# Patient Record
Sex: Female | Born: 2000 | Hispanic: Yes | Marital: Single | Smoking: Never smoker
Health system: Southern US, Community
[De-identification: ages and names within clinical notes are randomized; demographics above are authoritative.]

---
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
## 2020-02-23 ENCOUNTER — Other Ambulatory Visit: Payer: Self-pay | Admitting: Women's Health

## 2020-04-04 ENCOUNTER — Ambulatory Visit (INDEPENDENT_AMBULATORY_CARE_PROVIDER_SITE_OTHER): Payer: Self-pay | Admitting: Women's Health

## 2020-04-04 ENCOUNTER — Other Ambulatory Visit (HOSPITAL_COMMUNITY)
Admission: RE | Admit: 2020-04-04 | Discharge: 2020-04-04 | Disposition: A | Discharge status: Home / Self Care | Payer: Medicaid - Out of State | Source: Ambulatory Visit | Attending: Women's Health | Admitting: Women's Health

## 2020-04-04 ENCOUNTER — Encounter: Payer: Self-pay | Admitting: Women's Health

## 2020-04-04 ENCOUNTER — Other Ambulatory Visit: Payer: Self-pay

## 2020-04-04 DIAGNOSIS — M549 Dorsalgia, unspecified: Secondary | ICD-10-CM

## 2020-04-04 DIAGNOSIS — O99891 Other specified diseases and conditions complicating pregnancy: Secondary | ICD-10-CM

## 2020-04-04 DIAGNOSIS — O0990 Supervision of high risk pregnancy, unspecified, unspecified trimester: Secondary | ICD-10-CM

## 2020-04-04 DIAGNOSIS — Z3490 Encounter for supervision of normal pregnancy, unspecified, unspecified trimester: Secondary | ICD-10-CM | POA: Insufficient documentation

## 2020-04-04 MED ORDER — BLOOD PRESSURE KIT DEVI: 1.0000 | 0 refills | Status: DC | PRN

## 2020-04-04 NOTE — Progress Notes (Signed)
History:   Donna Simon is a 19 y.o. G1P0 at 12w6dby LMP being seen today for her first obstetrical visit.  Her obstetrical history is significant for first pregnancy. Patient does intend to breast feed. Pregnancy history fully reviewed.  Pt reports this is a desired and planned pregnancy. Allergies: NKDA Current Medications: PNVs PMH: No HTN, DM, asthma. PSH: appendectomy OB Hx: first pregnancy Social Hx: pt does not smoke, drink, or use drugs. Family Hx: none  Patient reports backache.      HISTORY: OB History  Gravida Para Term Preterm AB Living  1 0 0 0 0 0  SAB TAB Ectopic Multiple Live Births  0 0 0 0 0    # Outcome Date GA Lbr Len/2nd Weight Sex Delivery Anes PTL Lv  1 Current             Last pap smear n/a d/t age 89  History reviewed. No pertinent past medical history. Past Surgical History:  Procedure Laterality Date  . APPENDECTOMY Right 2009   History reviewed. No pertinent family history. Social History   Tobacco Use  . Smoking status: Not on file  Substance Use Topics  . Alcohol use: Not on file  . Drug use: Not on file   No Known Allergies Current Outpatient Medications on File Prior to Visit  Medication Sig Dispense Refill  . Prenatal Vit-Fe Fumarate-FA (PRENATAL MULTIVITAMIN) TABS tablet Take 1 tablet by mouth daily at 12 noon.     No current facility-administered medications on file prior to visit.    Review of Systems Pertinent items noted in HPI and remainder of comprehensive ROS otherwise negative. Physical Exam:   Vitals:   04/04/20 1429 04/04/20 1430  BP: 107/65   Pulse: 74   Weight: 168 lb 12.8 oz (76.6 kg)   Height:  _0  (1.651 m)   Fetal Heart Rate (bpm): 146  Uterus:   15wk size  Pelvic Exam: Perineum: no hemorrhoids, normal perineum   Vulva: normal external genitalia, no lesions   Vagina:  Not examined   Cervix: Not examined   Adnexa: Not examined   Bony Pelvis: Not examined  System: General: well-developed,  well-nourished female in no acute distress   Breasts:  Not examined   Skin: normal coloration and turgor, no rashes   Neurologic: oriented, normal, negative, normal mood   Extremities: normal strength, tone, and muscle mass, ROM of all joints is normal   HEENT PERRLA, extraocular movement intact and sclera clear, anicteric   Mouth/Teeth Not examined   Neck supple and no masses   Cardiovascular: regular rate and rhythm   Respiratory:  no respiratory distress, normal breath sounds   Abdomen: soft, non-tender; bowel sounds normal; no masses,  no organomegaly    Assessment:    Pregnancy: G1P0 Patient Active Problem List   Diagnosis Date Noted  . Encounter for supervision of normal first pregnancy in second trimester 04/04/2020  . Back pain in pregnancy 04/04/2020     Plan:    1. Supervision of high risk pregnancy, antepartum - Blood Pressure Monitoring (BLOOD PRESSURE KIT) DEVI; 1 Device by Does not apply route as needed.  Dispense: 1 each; Refill: 0 - AFP, Serum, Open Spina Bifida - CBC/D/Plt+RPR+Rh+ABO+Rub Ab... - Urine Culture - Cervicovaginal ancillary only( [place]) - HgB A1c - [place] OB COMP + 14 WK; Future  2. Back pain in pregnancy - AMB referral to rehabilitation   Initial labs drawn. Continue prenatal vitamins. Problem list reviewed and  updated. Genetic Screening discussed, NIPS: results reviewed. Ultrasound discussed; fetal anatomic survey: ordered. Anticipatory guidance about prenatal visits given including labs, ultrasounds, and testing. Discussed usage of Bab[employer]s and virtual visits as additional source of managing and completing prenatal visits in midst of lcoronavirus and pandemic.   Encouraged to complete MyChart Registration for her ability to  review results, send requests, and have questions addressed.  The nature of [place] [facility] with multiple MDs and Advanced Practice Providers was explained to  patient; also emphasized that residents, students are part of our team. Routine obstetric precautions reviewed. Encouraged to seek out care at office or emergency room [facility] preferred) for urgent and/or emergent concerns. Return in about 4 weeks (around 05/02/2020) for in-person [name]/APP OK, needs anatomy scan scheduled, pelvic PT referral.   Clarisa Fling, NP  3:07 PM 04/04/2020

## 2020-04-04 NOTE — Patient Instructions (Addendum)
Maternity Assessment Unit (MAU)  The Maternity Assessment Unit (MAU) is located at the [facility] at [facility]. The address is: [address] [place], [place], [place] [address]. Please see map below for additional directions.    The Maternity Assessment Unit is designed to help you during your pregnancy, and for up to 6 weeks after delivery, with any pregnancy- or postpartum-related emergencies, if you think you are in labor, or if your water has broken. For example, if you experience nausea and vomiting, vaginal bleeding, severe abdominal or pelvic pain, elevated blood pressure or other problems related to your pregnancy or postpartum time, please come to the Maternity Assessment Unit for assistance.        Second Trimester of Pregnancy The second trimester is from week 14 through week 27 (months 4 through 6). The second trimester is often a time when you feel your best. Your body has adjusted to being pregnant, and you begin to feel better physically. Usually, morning sickness has lessened or quit completely, you may have more energy, and you may have an increase in appetite. The second trimester is also a time when the fetus is growing rapidly. At the end of the sixth month, the fetus is about 9 inches long and weighs about 1 pounds. You will likely begin to feel the baby move (quickening) between 16 and 20 weeks of pregnancy. Body changes during your second trimester Your body continues to go through many changes during your second trimester. The changes vary from woman to woman.  Your weight will continue to increase. You will notice your lower abdomen bulging out.  You may begin to get stretch marks on your hips, abdomen, and breasts.  You may develop headaches that can be relieved by medicines. The medicines should be approved by your health care provider.  You may urinate more often because the fetus is pressing on your bladder.  You may  develop or continue to have heartburn as a result of your pregnancy.  You may develop constipation because certain hormones are causing the muscles that push waste through your intestines to slow down.  You may develop hemorrhoids or swollen, bulging veins (varicose veins).  You may have back pain. This is caused by: ? Weight gain. ? Pregnancy hormones that are relaxing the joints in your pelvis. ? A shift in weight and the muscles that support your balance.  Your breasts will continue to grow and they will continue to become tender.  Your gums may bleed and may be sensitive to brushing and flossing.  Dark spots or blotches (chloasma, mask of pregnancy) may develop on your face. This will likely fade after the baby is born.  A dark line from your belly button to the pubic area (linea nigra) may appear. This will likely fade after the baby is born.  You may have changes in your hair. These can include thickening of your hair, rapid growth, and changes in texture. Some women also have hair loss during or after pregnancy, or hair that feels dry or thin. Your hair will most likely return to normal after your baby is born. What to expect at prenatal visits During a routine prenatal visit:  You will be weighed to make sure you and the fetus are growing normally.  Your blood pressure will be taken.  Your abdomen will be measured to track your baby's growth.  The fetal heartbeat will be listened to.  Any test results from the previous visit  will be discussed. Your health care provider may ask you:  How you are feeling.  If you are feeling the baby move.  If you have had any abnormal symptoms, such as leaking fluid, bleeding, severe headaches, or abdominal cramping.  If you are using any tobacco products, including cigarettes, chewing tobacco, and electronic cigarettes.  If you have any questions. Other tests that may be performed during your second trimester include:  Blood tests  that check for: ? Low iron levels (anemia). ? High blood sugar that affects pregnant women (gestational diabetes) between 67 and 28 weeks. ? Rh antibodies. This is to check for a protein on red blood cells (Rh factor).  Urine tests to check for infections, diabetes, or protein in the urine.  An ultrasound to confirm the proper growth and development of the baby.  An amniocentesis to check for possible genetic problems.  Fetal screens for spina bifida and Down syndrome.  HIV (human immunodeficiency virus) testing. Routine prenatal testing includes screening for HIV, unless you choose not to have this test. Follow these instructions at home: Medicines  Follow your health care provider's instructions regarding medicine use. Specific medicines may be either safe or unsafe to take during pregnancy.  Take a prenatal vitamin that contains at least 600 micrograms (mcg) of folic acid.  If you develop constipation, try taking a stool softener if your health care provider approves. Eating and drinking   Eat a balanced diet that includes fresh fruits and vegetables, whole grains, good sources of protein such as meat, eggs, or tofu, and low-fat dairy. Your health care provider will help you determine the amount of weight gain that is right for you.  Avoid raw meat and uncooked cheese. These carry germs that can cause birth defects in the baby.  If you have low calcium intake from food, talk to your health care provider about whether you should take a daily calcium supplement.  Limit foods that are high in fat and processed sugars, such as fried and sweet foods.  To prevent constipation: ? Drink enough fluid to keep your urine clear or pale yellow. ? Eat foods that are high in fiber, such as fresh fruits and vegetables, whole grains, and beans. Activity  Exercise only as directed by your health care provider. Most women can continue their usual exercise routine during pregnancy. Try to  exercise for 30 minutes at least 5 days a week. Stop exercising if you experience uterine contractions.  Avoid heavy lifting, wear low heel shoes, and practice good posture.  A sexual relationship may be continued unless your health care provider directs you otherwise. Relieving pain and discomfort  Wear a good support bra to prevent discomfort from breast tenderness.  Take warm sitz baths to soothe any pain or discomfort caused by hemorrhoids. Use hemorrhoid cream if your health care provider approves.  Rest with your legs elevated if you have leg cramps or low back pain.  If you develop varicose veins, wear support hose. Elevate your feet for 15 minutes, 3-4 times a day. Limit salt in your diet. Prenatal Care  Write down your questions. Take them to your prenatal visits.  Keep all your prenatal visits as told by your health care provider. This is important. Safety  Wear your seat belt at all times when driving.  Make a list of emergency phone numbers, including numbers for family, friends, the hospital, and police and fire departments. General instructions  Ask your health care provider for a referral to a  local prenatal education class. Begin classes no later than the beginning of month 6 of your pregnancy.  Ask for help if you have counseling or nutritional needs during pregnancy. Your health care provider can offer advice or refer you to specialists for help with various needs.  Do not use hot tubs, steam rooms, or saunas.  Do not douche or use tampons or scented sanitary pads.  Do not cross your legs for long periods of time.  Avoid cat litter boxes and soil used by cats. These carry germs that can cause birth defects in the baby and possibly loss of the fetus by miscarriage or stillbirth.  Avoid all smoking, herbs, alcohol, and unprescribed drugs. Chemicals in these products can affect the formation and growth of the baby.  Do not use any products that contain nicotine  or tobacco, such as cigarettes and e-cigarettes. If you need help quitting, ask your health care provider.  Visit your dentist if you have not gone yet during your pregnancy. Use a soft toothbrush to brush your teeth and be gentle when you floss. Contact a health care provider if:  You have dizziness.  You have mild pelvic cramps, pelvic pressure, or nagging pain in the abdominal area.  You have persistent nausea, vomiting, or diarrhea.  You have a bad smelling vaginal discharge.  You have pain when you urinate. Get help right away if:  You have a fever.  You are leaking fluid from your vagina.  You have spotting or bleeding from your vagina.  You have severe abdominal cramping or pain.  You have rapid weight gain or weight loss.  You have shortness of breath with chest pain.  You notice sudden or extreme swelling of your face, hands, ankles, feet, or legs.  You have not felt your baby move in over an hour.  You have severe headaches that do not go away when you take medicine.  You have vision changes. Summary  The second trimester is from week 14 through week 27 (months 4 through 6). It is also a time when the fetus is growing rapidly.  Your body goes through many changes during pregnancy. The changes vary from woman to woman.  Avoid all smoking, herbs, alcohol, and unprescribed drugs. These chemicals affect the formation and growth your baby.  Do not use any tobacco products, such as cigarettes, chewing tobacco, and e-cigarettes. If you need help quitting, ask your health care provider.  Contact your health care provider if you have any questions. Keep all prenatal visits as told by your health care provider. This is important. This information is not intended to replace advice given to you by your health care provider. Make sure you discuss any questions you have with your health care provider. Document Revised: 11/13/2018 Document Reviewed: 08/27/2016 Elsevier  Patient Education  2020 Elsevier Inc.        Round Ligament Pain  The round ligament is a cord of muscle and tissue that helps support the uterus. It can become a source of pain during pregnancy if it becomes stretched or twisted as the baby grows. The pain usually begins in the second trimester (13-28 weeks) of pregnancy, and it can come and go until the baby is delivered. It is not a serious problem, and it does not cause harm to the baby. Round ligament pain is usually a short, sharp, and pinching pain, but it can also be a dull, lingering, and aching pain. The pain is felt in the lower side of  the abdomen or in the groin. It usually starts deep in the groin and moves up to the outside of the hip area. The pain may occur when you:  Suddenly change position, such as quickly going from a sitting to standing position.  Roll over in bed.  Cough or sneeze.  Do physical activity. Follow these instructions at home:   Watch your condition for any changes.  When the pain starts, relax. Then try any of these methods to help with the pain: ? Sitting down. ? Flexing your knees up to your abdomen. ? Lying on your side with one pillow under your abdomen and another pillow between your legs. ? Sitting in a warm bath for 15-20 minutes or until the pain goes away.  Take over-the-counter and prescription medicines only as told by your health care provider.  Move slowly when you sit down or stand up.  Avoid long walks if they cause pain.  Stop or reduce your physical activities if they cause pain.  Keep all follow-up visits as told by your health care provider. This is important. Contact a health care provider if:  Your pain does not go away with treatment.  You feel pain in your back that you did not have before.  Your medicine is not helping. Get help right away if:  You have a fever or chills.  You develop uterine contractions.  You have vaginal bleeding.  You have nausea  or vomiting.  You have diarrhea.  You have pain when you urinate. Summary  Round ligament pain is felt in the lower abdomen or groin. It is usually a short, sharp, and pinching pain. It can also be a dull, lingering, and aching pain.  This pain usually begins in the second trimester (13-28 weeks). It occurs because the uterus is stretching with the growing baby, and it is not harmful to the baby.  You may notice the pain when you suddenly change position, when you cough or sneeze, or during physical activity.  Relaxing, flexing your knees to your abdomen, lying on one side, or taking a warm bath may help to get rid of the pain.  Get help from your health care provider if the pain does not go away or if you have vaginal bleeding, nausea, vomiting, diarrhea, or painful urination. This information is not intended to replace advice given to you by your health care provider. Make sure you discuss any questions you have with your health care provider. Document Revised: 01/07/2018 Document Reviewed: 01/07/2018 Elsevier Patient Education  2020 Elsevier Inc.        Pregnancy and COVID-19 Coronavirus disease, also called COVID-19, is an infection of the lungs and airways (respiratory tract). It is unclear at this time if pregnancy makes it more likely for you to get COVID-19, or what effects the infection may have on your unborn baby. However, pregnancy causes changes to your heart, lungs, and your body's disease-fighting system (immune system). Some of these changes make it more likely for you to get sick and have more serious illness. Therefore, it is important for you to take precautions in order to protect yourself and your unborn baby. There have been studies showing that obesity and diabetes may put you at higher risk for serious illness. If you are pregnant and are obese or have diabetes, you should take extra precautions to protect yourself from the virus. Work with your health care team  to develop a plan to protect yourself from all infections, including COVID-19. This  is one way for you to stay healthy during your pregnancy and to keep your baby healthy as well. How does this affect me? If you get COVID-19, there is a risk that you may:  Get a respiratory illness that can lead to pneumonia.  Give birth to your baby before 37 weeks of pregnancy (premature birth). If you have or may have COVID-19, your health care provider may recommend special precautions around your pregnancy. This may affect how you:  Receive care before delivery (prenatal care). How you visit your health care provider may change. Tests and scans may need to be performed differently.  Receive care during labor and delivery. This may affect your birth plan, including who may be with you during labor and delivery.  Receive care after you deliver your baby (postpartum care). You may stay longer in the hospital and in a special room.  Feed your baby after he or she is born. Pregnancy can be an especially stressful time because of the changes in your body and the preparation involved in becoming a parent. In addition, you may be feeling especially fearful, anxious, or stressed because of COVID-19 and how it is affecting you. How does this affect my baby? It is not known whether a mother will transmit the virus to her unborn baby. There is a risk that if you get COVID-19:  The virus that causes COVID-19 can pass to your baby.  You may have premature birth. Your baby may require more medical care if this happens. What can I do to lower my risk?  There is no vaccine to help prevent COVID-19. However, there are actions that you can take to protect yourself and others from this virus. Cleaning and personal hygiene  Wash your hands often with soap and water for at least 20 seconds. If soap and water are not available, use alcohol-based hand sanitizer.  Avoid touching your mouth, face, eyes, or nose.  Clean  and disinfect objects and surfaces that are frequently touched every day. These may include: ? Counters and tables. ? Doorknobs and light switches. ? Sinks and faucets. ? Electronics such as phones, remote controls, keyboards, computers, and tablets. Stay away from others  Stay away from people who are sick, if possible.  Avoid social gatherings and travel.  Stay home as much as possible. Follow these instructions: Breastfeeding It is not known if the virus that causes COVID-19 can pass through breast milk to your baby. You should make a plan for feeding your infant with your family and your health care team. If you have or may have COVID-19, your health care provider may recommend that you take precautions while breastfeeding, such as:  Washing your hands before feeding your baby.  Wearing a mask while feeding your baby.  Pumping or expressing breast milk to feed to your baby. If possible, ask someone in your household who is not sick to feed your baby the expressed breast milk. ? Wash your hands before touching pump parts. ? Wash and disinfect all pump parts after expressing milk. Follow the manufacturer's instructions to clean and disinfect all pump parts. General instructions  If you think you have a COVID-19 infection, contact your health care provider right away. Tell your health care provider that you think you may have a COVID-19 infection.  Follow your health care provider's instructions on taking medicines. Some medicines may be unsafe to take during pregnancy.  Cover your mouth and nose by wearing a mask or other cloth covering over  your face when you go out in public.  Find ways to manage stress. These may include: ? Using relaxation techniques like meditation and deep breathing. ? Getting regular exercise. Most women can continue their usual exercise routine during pregnancy. Ask your health care provider what activities are safe for you. ? Seeking support from family,  friends, or spiritual resources. If you cannot be together in person, you can still connect by phone calls, texts, video calls, or online messaging. ? Spending time doing relaxing activities that you enjoy, like listening to music or reading a good book.  Ask for help if you have counseling or nutritional needs during pregnancy. Your health care provider can offer advice or refer you to resources or specialists who can help you with various needs.  Keep all follow-up visits as told by your health care provider. This is important. Where to find more information Centers for Disease Control and Prevention (CDC): AffordableShare.com.br World Health Organization [facility]): PokerPortraits.es [employer] (ACOG): BuyDucts.dk Questions to ask your health care team  What should I do if I have COVID-19 symptoms?  How will COVID-19 affect my prenatal care visits, tests and scans, labor and delivery, and postpartum care?  Should I plan to breastfeed my baby?  Where can I find mental health resources?  Where can I find support if I have financial concerns? Contact a health care provider if:  You have signs and symptoms of infection, including a fever or cough. Tell your health care team that you think you may have a COVID-19 infection.  You have strong emotions, such as sadness or anxiety.  You feel unsafe in your home and need help finding a safe place to live.  You have bloody or watery vaginal discharge or vaginal bleeding. Get help right away if:  You have signs or symptoms of labor before 37 weeks of pregnancy. These include: ? Contractions that are 5 minutes or less apart, or that increase in frequency, intensity, or length. ? Sudden, sharp pain in the abdomen or in the lower back. ? A gush or trickle  of fluid from your vagina.  You have signs of more serious illness such as: ? You have difficulty breathing. ? You have chest pain. ? You have a fever greater than 102F (39C) or higher that does not go away. ? You cannot drink fluids without vomiting. ? You feel extremely weak or you faint. These symptoms may represent a serious problem that is an emergency. Do not wait to see if the symptoms will go away. Get medical help right away. Call your local emergency services (911 in the [place] Do not drive yourself to the hospital. Summary  Coronavirus disease, also called COVID-19, is an infection of the lungs and airways (respiratory tract). It is unclear at this time if pregnancy makes you more susceptible to COVID-19 and what effects it may have on unborn babies.  It is important to take precautions to protect yourself and your developing baby. This includes washing your hands often, avoiding touching your mouth, face, eyes, or nose, avoiding social gatherings and travel, and staying away from people who are sick.  If you think you have a COVID-19 infection, contact your health care provider right away. Tell your health care provider that you think you may have a COVID-19 infection.  If you have or may have COVID-19, your health care provider may recommend special precautions during your pregnancy, labor and delivery, and after your baby is born. This  information is not intended to replace advice given to you by your health care provider. Make sure you discuss any questions you have with your health care provider. Document Revised: 05/14/2019 Document Reviewed: 11/17/2018 Elsevier Patient Education  [place].

## 2020-04-05 LAB — CERVICOVAGINAL ANCILLARY ONLY
Chlamydia: NEGATIVE
Comment: NEGATIVE
Comment: NEGATIVE
Comment: NORMAL
Neisseria Gonorrhea: NEGATIVE
Trichomonas: NEGATIVE

## 2020-04-06 LAB — AFP, SERUM, OPEN SPINA BIFIDA
AFP MoM: 1.41
AFP Value: 45.1 ng/mL
Gest. Age on Collection Date: 16 weeks
Maternal Age At EDD: 21.3 yr
OSBR Risk 1 IN: 3501
Test Results:: NEGATIVE
Weight: 168 [lb_av]

## 2020-04-06 LAB — CBC/D/PLT+RPR+RH+ABO+RUB AB...
Antibody Screen: NEGATIVE
Basophils Absolute: 0 10*3/uL (ref 0.0–0.2)
Basos: 0 %
EOS (ABSOLUTE): 0 10*3/uL (ref 0.0–0.4)
Eos: 0 %
HCV Ab: <0.1 s/co ratio (ref 0.0–0.9)
HIV Screen 4th Generation wRfx: NONREACTIVE
Hematocrit: 36.7 % (ref 34.0–46.6)
Hemoglobin: 12.6 g/dL (ref 11.1–15.9)
Hepatitis B Surface Ag: NEGATIVE
Immature Grans (Abs): 0 10*3/uL (ref 0.0–0.1)
Immature Granulocytes: 0 %
Lymphocytes Absolute: 2 10*3/uL (ref 0.7–3.1)
Lymphs: 25 %
MCH: 32.5 pg (ref 26.6–33.0)
MCHC: 34.3 g/dL (ref 31.5–35.7)
MCV: 95 fL (ref 79–97)
Monocytes Absolute: 0.5 10*3/uL (ref 0.1–0.9)
Monocytes: 6 %
Neutrophils Absolute: 5.5 10*3/uL (ref 1.4–7.0)
Neutrophils: 69 %
Platelets: 226 10*3/uL (ref 150–450)
RBC: 3.88 x10E6/uL (ref 3.77–5.28)
RDW: 13.2 % (ref 11.7–15.4)
RPR Ser Ql: NONREACTIVE
Rh Factor: POSITIVE
Rubella Antibodies, IGG: 3.44 index (ref >0.99)
WBC: 8.1 10*3/uL (ref 3.4–10.8)

## 2020-04-06 LAB — HEMOGLOBIN A1C
Est. average glucose Bld gHb Est-mCnc: 100 mg/dL
Hgb A1c MFr Bld: 5.1 % (ref 4.8–5.6)

## 2020-04-06 LAB — URINE CULTURE: Organism ID, Bacteria: NO GROWTH

## 2020-04-06 LAB — HCV INTERPRETATION

## 2020-04-12 ENCOUNTER — Telehealth: Payer: Self-pay

## 2020-04-12 NOTE — Telephone Encounter (Signed)
TC to pt regarding not feeling well per front desk  Pt wanting sooner appt and non ava I called pt to discuss sx's and concerns no answer LVM.

## 2020-05-02 ENCOUNTER — Other Ambulatory Visit: Payer: Self-pay

## 2020-05-02 ENCOUNTER — Ambulatory Visit (INDEPENDENT_AMBULATORY_CARE_PROVIDER_SITE_OTHER): Payer: Medicaid Other | Admitting: Advanced Practice Midwife

## 2020-05-02 VITALS — BP 113/66 | HR 71 | Wt 175.0 lb

## 2020-05-02 DIAGNOSIS — Z3A19 19 weeks gestation of pregnancy: Secondary | ICD-10-CM

## 2020-05-02 DIAGNOSIS — M549 Dorsalgia, unspecified: Secondary | ICD-10-CM

## 2020-05-02 DIAGNOSIS — O99891 Other specified diseases and conditions complicating pregnancy: Secondary | ICD-10-CM

## 2020-05-02 DIAGNOSIS — Z3402 Encounter for supervision of normal first pregnancy, second trimester: Secondary | ICD-10-CM

## 2020-05-02 NOTE — Progress Notes (Signed)
   PRENATAL VISIT NOTE  Subjective:  Donna Simon is a 19 y.o. G1P0 at [redacted]w[redacted]d being seen today for ongoing prenatal care.  She is currently monitored for the following issues for this low-risk pregnancy and has Encounter for supervision of normal first pregnancy in second trimester and Back pain in pregnancy on their problem list.  Patient reports no complaints.  Contractions: Not present. Vag. Bleeding: None.  Movement: Present. Denies leaking of fluid.   The following portions of the patient's history were reviewed and updated as appropriate: allergies, current medications, past family history, past medical history, past social history, past surgical history and problem list.   Objective:   Vitals:   05/02/20 0835  BP: 113/66  Pulse: 71  Weight: 175 lb (79.4 kg)    Fetal Status: Fetal Heart Rate (bpm): 150   Movement: Present     General:  Alert, oriented and cooperative. Patient is in no acute distress.  Skin: Skin is warm and dry. No rash noted.   Cardiovascular: Normal heart rate noted  Respiratory: Normal respiratory effort, no problems with respiration noted  Abdomen: Soft, gravid, appropriate for gestational age.  Pain/Pressure: Present     Pelvic: Cervical exam deferred        Extremities: Normal range of motion.  Edema: None  Mental Status: Normal mood and affect. Normal behavior. Normal judgment and thought content.   Assessment and Plan:  Pregnancy: G1P0 at [redacted]w[redacted]d 1. Encounter for supervision of normal first pregnancy in second trimester --Anticipatory guidance about next visits/weeks of pregnancy given. --Anatomy [place] scheduled today --pt desires waterbirth, discussed, info on class/contraindications given today --Midwife for next visit in 4 weeks  2. Back pain in pregnancy --No pain currently   Preterm labor symptoms and general obstetric precautions including but not limited to vaginal bleeding, contractions, leaking of fluid and fetal movement were reviewed in  detail with the patient. Please refer to After Visit Summary for other counseling recommendations.   Return in about 4 weeks (around 05/30/2020).  Future Appointments  Date Time Provider Department Center  05/03/2020  7:45 AM [facility]  05/29/2020  4:00 PM Leftwich-Kirby, Wilmer Floor, CNM [facility] None    Sharen Counter, CNM

## 2020-05-02 NOTE — Patient Instructions (Addendum)
Considering Waterbirth? °Guide for patients at [facility] °Why consider waterbirth? °• Gentle birth for babies  °• Less pain medicine used in labor  °• May allow for passive descent/less pushing  °• May reduce perineal tears  °• More mobility and instinctive maternal position changes  °• Increased maternal relaxation  °• Reduced blood pressure in labor  ° °Is waterbirth safe? What are the risks of infection, drowning or other complications? °• Infection:  °• Very low risk (3.7 % for tub vs 4.8% for bed)  °• 7 in 8000 waterbirths with documented infection  °• Poorly cleaned equipment most common cause  °• Slightly lower group B strep transmission rate  °• Drowning  °• Maternal:  °• Very low risk  °• Related to seizures or fainting  °• Newborn:  °• Very low risk. No evidence of increased risk of respiratory problems in multiple large studies  °• Physiological protection from breathing under water  °• Avoid underwater birth if there are any fetal complications  °• Once baby's head is out of the water, keep it out.  °• Birth complication  °• Some reports of cord trauma, but risk decreased by bringing baby to surface gradually  °• No evidence of increased risk of shoulder dystocia. Mothers can usually change positions faster in water than in a bed, possibly aiding the maneuvers to free the shoulder.  °? °You must attend a Waterbirth class at [facility] [place] °• 3rd Wednesday of every month from 7-9pm  °• Free  °• Register by calling [phone] or online at [email or web address]  °• Bring us the certificate from the class to your prenatal appointment  °Meet with a midwife at 36 weeks to see if you can still plan a waterbirth and to sign the consent.  ° °If you plan a waterbirth at [facility] at , you can opt to purchase the following: °• Fish Net °• Bathing suit top (optional)  °• Long-handled mirror (optional)  °•  °Things that would  prevent you from having a waterbirth: °• Unknown or Positive COVID-19 diagnosis upon admission to hospital  °• Premature, <37wks  °• Previous cesarean birth  °• Presence of thick meconium-stained fluid  °• Multiple gestation (Twins, triplets, etc.)  °• Uncontrolled diabetes or gestational diabetes requiring medication  °• Hypertension requiring medication or diagnosis of pre-eclampsia  °• Heavy vaginal bleeding  °• Non-reassuring fetal heart rate  °• Active infection (MRSA, etc.). Group B Strep is NOT a contraindication for waterbirth.  °• If your labor has to be induced and induction method requires continuous monitoring of the baby's heart rate  °• Other risks/issues identified by your obstetrical provider  °Please remember that birth is unpredictable. Under certain unforeseeable circumstances your provider may advise against giving birth in the tub. These decisions will be made on a case-by-case basis and with the safety of you and your baby as our highest priority. ° °**Please remember that in order to have a waterbirth, you must test Negative to COVID-19 upon admission to the hospital.** ° °

## 2020-05-03 ENCOUNTER — Ambulatory Visit: Payer: Medicaid Other | Attending: Women's Health

## 2020-05-03 ENCOUNTER — Other Ambulatory Visit: Payer: Self-pay | Admitting: Women's Health

## 2020-05-03 DIAGNOSIS — O0990 Supervision of high risk pregnancy, unspecified, unspecified trimester: Secondary | ICD-10-CM

## 2020-05-03 DIAGNOSIS — O358XX0 Maternal care for other (suspected) fetal abnormality and damage, not applicable or unspecified: Secondary | ICD-10-CM

## 2020-05-03 DIAGNOSIS — Z363 Encounter for antenatal screening for malformations: Secondary | ICD-10-CM

## 2020-05-03 DIAGNOSIS — Z3A20 20 weeks gestation of pregnancy: Secondary | ICD-10-CM

## 2020-05-08 ENCOUNTER — Encounter: Payer: Self-pay | Admitting: Women's Health

## 2020-05-08 DIAGNOSIS — O283 Abnormal ultrasonic finding on antenatal screening of mother: Secondary | ICD-10-CM | POA: Insufficient documentation

## 2020-05-29 ENCOUNTER — Telehealth (INDEPENDENT_AMBULATORY_CARE_PROVIDER_SITE_OTHER): Payer: Medicaid Other | Admitting: Advanced Practice Midwife

## 2020-05-29 ENCOUNTER — Other Ambulatory Visit: Payer: Self-pay

## 2020-05-29 VITALS — BP 127/67 | HR 75

## 2020-05-29 DIAGNOSIS — O283 Abnormal ultrasonic finding on antenatal screening of mother: Secondary | ICD-10-CM

## 2020-05-29 DIAGNOSIS — O99891 Dorsalgia, unspecified: Secondary | ICD-10-CM

## 2020-05-29 DIAGNOSIS — O26892 Other specified pregnancy related conditions, second trimester: Secondary | ICD-10-CM

## 2020-05-29 DIAGNOSIS — M549 Dorsalgia, unspecified: Secondary | ICD-10-CM

## 2020-05-29 DIAGNOSIS — O368120 Decreased fetal movements, second trimester, not applicable or unspecified: Secondary | ICD-10-CM

## 2020-05-29 DIAGNOSIS — Z3402 Encounter for supervision of normal first pregnancy, second trimester: Secondary | ICD-10-CM

## 2020-05-29 DIAGNOSIS — Z3A23 23 weeks gestation of pregnancy: Secondary | ICD-10-CM

## 2020-05-29 NOTE — Progress Notes (Signed)
I connected with  Donna Simon on 05/29/20 by a video enabled telemedicine application and verified that I am speaking with the correct person using two identifiers.   I discussed the limitations of evaluation and management by telemedicine. The patient expressed understanding and agreed to proceed.   MyChart OB, c/o pressure and baby moving less.

## 2020-05-29 NOTE — Progress Notes (Signed)
OBSTETRICS PRENATAL VIRTUAL VISIT ENCOUNTER NOTE  Provider location: [facility] [place]   I connected with Donna Simon on 05/29/20 at  4:00 PM EDT by MyChart Video Encounter at home and verified that I am speaking with the correct person using two identifiers.   I discussed the limitations, risks, security and privacy concerns of performing an evaluation and management service virtually and the availability of in person appointments. I also discussed with the patient that there may be a patient responsible charge related to this service. The patient expressed understanding and agreed to proceed. Subjective:  Donna Simon is a 19 y.o. G1P0 at [redacted]w[redacted]d being seen today for ongoing prenatal care.  She is currently monitored for the following issues for this low-risk pregnancy and has Encounter for supervision of normal first pregnancy in second trimester; Back pain in pregnancy; and Echogenic intracardiac focus of fetus on prenatal ultrasound on their problem list.  Patient reports decreased fetal movement.  Contractions: Not present. Vag. Bleeding: None.  Movement: Present. Denies any leaking of fluid.   The following portions of the patient's history were reviewed and updated as appropriate: allergies, current medications, past family history, past medical history, past social history, past surgical history and problem list.   Objective:   Vitals:   05/29/20 1325  BP: 127/67  Pulse: 75    Fetal Status:     Movement: Present     General:  Alert, oriented and cooperative. Patient is in no acute distress.  Respiratory: Normal respiratory effort, no problems with respiration noted  Mental Status: Normal mood and affect. Normal behavior. Normal judgment and thought content.  Rest of physical exam deferred due to type of encounter  Imaging: [place] MFM OB DETAIL +14 WK  Result Date: 05/03/2020 ----------------------------------------------------------------------   OBSTETRICS REPORT                       (Signed Final 05/03/2020 09:09 am) ---------------------------------------------------------------------- Patient Info  ID #:       [id number]                          D.O.B.:  [birth year] (19 yrs)  Name:       Donna Simon                 Visit Date: 05/03/2020 07:52 am ---------------------------------------------------------------------- Performed By  Attending:        Lin Landsman      Referred By:      Marylen Ponto                    MD  Performed By:     Lenise Arena        [facility] ---------------------------------------------------------------------- Orders  #  Description                           Code        Ordered By  1  [place] MFM OB DETAIL +14 WK               [id number]    NICOLE NUGENT ----------------------------------------------------------------------  #  Order #                     Accession #                Episode #  1  [id number]                   [id number]                 [id number] ---------------------------------------------------------------------- Indications  Encounter for antenatal screening for          Z36.3  malformations (neg AFP)  [redacted] weeks gestation of pregnancy                Z3A.20  Echogenic intracardiac focus of the heart      O35.8XX0  (EIF) ---------------------------------------------------------------------- Vital Signs  Weight (lb): 175                               Height:        5'5"  BMI:         29.12 ---------------------------------------------------------------------- Fetal Evaluation  Num Of Fetuses:         1  Fetal Heart Rate(bpm):  155  Cardiac Activity:       Observed  Presentation:           Cephalic  Placenta:               Anterior  P. Cord Insertion:      Visualized, central   Amniotic Fluid  AFI FV:      Within normal limits                              Largest Pocket(cm)                              4.53 ---------------------------------------------------------------------- Biometry  BPD:      45.5  mm     G. Age:  19w 5d         39  %    CI:        71.96   %    70 - 86                                                          FL/HC:  19.9   %    16.8 - 19.8  HC:      170.7  mm     G. Age:  19w 5d         27  %    HC/AC:      1.11        1.09 - 1.39  AC:      154.2  mm     G. Age:  20w 4d         65  %    FL/BPD:     74.7   %  FL:         34  mm     G. Age:  20w 5d         67  %    FL/AC:      22.0   %    20 - 24  CER:      20.1  mm     G. Age:  19w 3d         52  %  LV:        5.2  mm  CM:        4.3  mm  Est. FW:     359  gm    0 lb 13 oz      75  % ---------------------------------------------------------------------- OB History  Gravidity:    1 ---------------------------------------------------------------------- Gestational Age  LMP:           20w 0d        Date:  12/15/19                 EDD:   09/20/20  U/S Today:     20w 1d                                        EDD:   09/19/20  Best:          Donna Simon 0d     Det. By:  LMP  (12/15/19)          EDD:   09/20/20 ---------------------------------------------------------------------- Anatomy  Cranium:               Appears normal         Aortic Arch:            Appears normal  Cavum:                 Appears normal         Ductal Arch:            Appears normal  Ventricles:            Appears normal         Diaphragm:              Appears normal  Choroid Plexus:        Appears normal         Stomach:                Appears normal, left  sided  Cerebellum:            Appears normal         Abdomen:                Appears normal  Posterior Fossa:       Appears normal         Abdominal Wall:         Appears nml (cord                                                                         insert, abd wall)  Nuchal Fold:           Not applicable (>20    Cord Vessels:           Appears normal ([redacted]                         wks GA)                                        vessel cord)  Face:                  Appears normal         Kidneys:                Appear normal                         (orbits and profile)  Lips:                  Appears normal         Bladder:                Appears normal  Thoracic:              Appears normal         Spine:                  Appears normal  Heart:                 Appears normal; EIF    Upper Extremities:      Appears normal  RVOT:                  Appears normal         Lower Extremities:      Appears normal  LVOT:                  Appears normal  Other:  Fetus appears to be a female. Heels and 5th digit visualized. 3VTV          visualized. ---------------------------------------------------------------------- Cervix Uterus Adnexa  Cervix  Length:           3.13  cm.  Normal appearance by transabdominal scan.  Uterus  No abnormality visualized.  Right Ovary  No adnexal mass visualized.  Left Ovary  No adnexal mass visualized.  Cul De Sac  No free fluid seen.  Adnexa  No abnormality visualized. ---------------------------------------------------------------------- Impression  Single intrauterine pregnancy here for a detailed anatomy  Normal anatomy with measurements consistent with dates  There is good fetal movement and amniotic fluid volume  An echogenic intracardiac focus was observed today. I  discussed that there is no increased risk to the function or  structure of the heart. In addition, in the context of a low risk  NIPS result the risk for aneuploidy are reduced. There were  no additional markers of aneuploidy observed.  I reviewed  that an ultrasound is a screening exam and that a diagnostic  exam via amnioticenetesis is the only test available to provide  a definitive result. She declined additional testing at this time.  ---------------------------------------------------------------------- Recommendations  Followup as clinically indicated. ----------------------------------------------------------------------               Lin Landsmanorenthian Booker, MD Electronically Signed Final Report   05/03/2020 09:09 am ----------------------------------------------------------------------   Assessment and Plan:  Pregnancy: G1P0 at 2780w5d 1. Encounter for supervision of normal first pregnancy in second trimester --Anticipatory guidance about next visits/weeks of pregnancy given. --Pt took waterbirth class, will send certificate. --Reviewed normal anatomy US, with EIF only, questions answered. --Next visit in 4 weeks in person for GTT  2. Echogenic intracardiac focus of fetus on prenatal ultrasound   3. Back pain in pregnancy --In PT  4. Decreased fetal movements in second trimester, single or unspecified fetus --Pt has doppler at home and is hearing FHT but reports less movement in last 2-3 days than usual. --Nurse visit today in the office for dopplers.  Preterm labor symptoms and general obstetric precautions including but not limited to vaginal bleeding, contractions, leaking of fluid and fetal movement were reviewed in detail with the patient. I discussed the assessment and treatment plan with the patient. The patient was provided an opportunity to ask questions and all were answered. The patient agreed with the plan and demonstrated an understanding of the instructions. The patient was advised to call back or seek an in-person office evaluation/[facility] for any urgent or concerning symptoms. Please refer to After Visit Summary for other counseling recommendations.   I provided 10 minutes of face-to-face time during this encounter.  Return in about 4 weeks (around 06/26/2020).  Future Appointments  Date Time Provider Department Center  05/29/2020  4:00 PM Hurshel PartyLeftwich-Kirby, Zohan Shiflet A, CNM  [facility] None  06/27/2020  4:00 PM Theressa MillardGray, Cheryl F, PT [facility]  07/04/2020  4:00 PM Theressa MillardGray, Cheryl F, PT [facility]  07/11/2020  4:00 PM Theressa MillardGray, Cheryl F, PT [facility]  07/25/2020  4:00 PM Theressa MillardGray, Cheryl F, PT [facility]    Sharen CounterLisa Leftwich-Kirby, CNM [facility]

## 2020-06-20 ENCOUNTER — Encounter: Payer: Medicaid Other | Attending: Women's Health | Admitting: Physical Therapy

## 2020-06-20 ENCOUNTER — Encounter: Payer: Self-pay | Admitting: Physical Therapy

## 2020-06-20 ENCOUNTER — Other Ambulatory Visit: Payer: Self-pay

## 2020-06-20 DIAGNOSIS — R252 Cramp and spasm: Secondary | ICD-10-CM | POA: Insufficient documentation

## 2020-06-20 DIAGNOSIS — M6281 Muscle weakness (generalized): Secondary | ICD-10-CM

## 2020-06-20 DIAGNOSIS — M545 Low back pain, unspecified: Secondary | ICD-10-CM | POA: Insufficient documentation

## 2020-06-20 NOTE — Therapy (Addendum)
[place] at [facility] [address], [place], [place], [address] Phone: [phone]   Fax:  [phone]  Physical Therapy Evaluation  Patient Details  Name: Donna Simon MRN: [id number] Date of Birth: Mar 13, 2001 Referring Provider (PT): Vernice Jefferson, NP   Encounter Date: 06/20/2020   PT End of Session - 06/20/20 1654    Visit Number 1    Date for PT Re-Evaluation 08/01/20    Authorization Type [employer]    PT Start Time 0459    PT Stop Time 9774    PT Time Calculation (min) 40 min    Activity Tolerance Patient tolerated treatment well;No increased pain    Behavior During Therapy [facility] for tasks assessed/performed           History reviewed. No pertinent past medical history.  Past Surgical History:  Procedure Laterality Date  . APPENDECTOMY Right 2009    There were no vitals filed for this visit.    Subjective Assessment - 06/20/20 1611    Subjective Patient started to have back pain since June 2021. As the pregnancy progresses the pain is worse.    Patient Stated Goals relief of the back pain    Currently in Pain? Yes    Pain Score 7     Pain Location Back    Pain Orientation Right;Left    Pain Descriptors / Indicators Sharp    Pain Type Acute pain    Pain Onset More than a month ago    Pain Frequency Intermittent    Aggravating Factors  laying on back and turn. most pain on right    Pain Relieving Factors not sure              [facility] PT Assessment - 06/20/20 0001      Assessment   Medical Diagnosis O99.891; M54.9 Back pain in pregnancy    Referring Provider (PT) Vernice Jefferson, NP    Onset Date/Surgical Date 01/04/20    Prior Therapy none      Precautions   Precautions Other (comment)    Precaution Comments pregnant   dur date is 09/20/2020, 22 weeks      Restrictions   Weight Bearing Restrictions No      Balance Screen   Has the patient fallen in the past 6 months Yes    How many times? 1    trying to skate   Has the patient had a decrease in activity level because of a fear of falling?  No    Is the patient reluctant to leave their home because of a fear of falling?  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    Leisure walking 3 miles per day      Cognition   Overall Cognitive Status Within Functional Limits for tasks assessed      Posture/Postural Control   Posture/Postural Control Postural limitations    Posture Comments pregnant posture      ROM / Strength   AROM / PROM / Strength AROM;PROM;Strength      AROM   Lumbar - Right Side Bend decreased by 25%    Lumbar - Left Side Bend decreased by 25%      Strength   Right Hip ABduction 3/5    Right Hip ADduction 4-/5    Left Hip ABduction  3/5    Left Hip ADduction 4-/5      Palpation   Spinal mobility decreased movement of L3-L5    SI assessment  left ilium is rotated posteriorly    Palpation comment tenderness located on the SI joint      Special Tests    Special Tests Sacrolliac Tests    Sacroiliac Tests  Pelvic Compression      Pelvic Compression   Findings Positive    Side Left    comment pain                      Objective measurements completed on examination: See above findings.     Pelvic Floor Special Questions - 06/20/20 0001    Prior Pregnancies No   presently pregnant   Urinary Leakage No            [facility] Adult PT Treatment/Exercise - 06/20/20 0001      Therapeutic Activites    Therapeutic Activities ADL's    ADL's turning in bed with keeping knees together and not rotation her back, laying on side with pillows between knees and feet and under the abdomen      Lumbar Exercises: Stretches   Quadruped Mid Back Stretch 3 reps    Quadruped Mid Back Stretch Limitations childs pose all three directions holding for 30 seconds      Lumbar  Exercises: Standing   Other Standing Lumbar Exercises standing with back against the wall and posteriorly tilting the pelvis with lower abdominal contraction holding for 5 seconds       Lumbar Exercises: Quadruped   Other Quadruped Lumbar Exercises quadruped transverse abdominus contraction for 5 seconds 10x                  PT Education - 06/20/20 1653    Education Details Access Code: [email or web address]; rolling in bed with knees together; how to lay on side with pillows    Person(s) Educated Patient    Methods Explanation;Demonstration;Handout    Comprehension Verbalized understanding;Returned demonstration            PT Short Term Goals - 06/20/20 1701      PT SHORT TERM GOAL #1   Title independent with initial HEP    Baseline not educated yet    Time 3    Period Weeks    Status New    Target Date 07/11/20             PT Long Term Goals - 06/20/20 1702      PT LONG TERM GOAL #1   Title independent with SI stabilization exercises    Baseline not educated yet    Time 6    Period Weeks    Status New    Target Date 08/01/20      PT LONG TERM GOAL #2   Title able to move in bed with no increased pain due to improve pelvic alignment and SI stability    Baseline pain level 8/10    Time 6    Period Weeks    Status New    Target Date 08/01/20      PT LONG TERM GOAL #3   Title understand ways to manage pain with correct body mechanics with daily tasks    Baseline not educated yet    Time 6    Period Weeks    Status New    Target Date 08/01/20  Plan - 06/20/20 1655    Clinical Impression Statement Patient is a 19 year old female with lumbar pain that started suddenly in June 2021. Patient is presently [redacted] weeks pregnant. Pain in lumbar is intermittent at level 7/10 and comes on suddenly. Left ilium is rotated posteriorly. Decreased movement of L3-L5. Lumbar sidebending is 25% limited. bilateral hip abduction and adductors are weak. Patient  will benefit from skilled therapy to reduce pain, improve SI stability and education on body mechanics.    Personal Factors and Comorbidities Comorbidity 1    Comorbidities [redacted]  weeks pregnant    Examination-Activity Limitations Bed Mobility;Caring for Others;Transfers    Examination-Participation Restrictions Community Activity;Cleaning    Stability/Clinical Decision Making Stable/Uncomplicated    Clinical Decision Making Low    Rehab Potential Excellent    PT Frequency 1x / week    PT Duration 6 weeks    PT Treatment/Interventions ADLs/Self Care Home Management;Moist Heat;Cryotherapy;Therapeutic activities;Therapeutic exercise;Manual techniques;Patient/family education;Neuromuscular re-education;Passive range of motion;Spinal Manipulations    PT Next Visit Plan check SI alignement; trunk rotation in sidely, sitting hip flexor isometrics; sidely iliopsoas pull back, clam, body mechanics    PT Home Exercise Plan Access Code: [email or web address]    Consulted and Agree with Plan of Care Patient           Patient will benefit from skilled therapeutic intervention in order to improve the following deficits and impairments:  Decreased endurance, Pain, Decreased activity tolerance, Decreased strength, Decreased range of motion, Increased muscle spasms  Visit Diagnosis: Muscle weakness (generalized) - Plan: PT plan of care cert/re-cert  Cramp and spasm - Plan: PT plan of care cert/re-cert  Midline low back pain without sciatica, unspecified chronicity - Plan: PT plan of care cert/re-cert     Problem List Patient Active Problem List   Diagnosis Date Noted  . Echogenic intracardiac focus of fetus on prenatal ultrasound 05/08/2020  . Encounter for supervision of normal first pregnancy in second trimester 04/04/2020  . Back pain in pregnancy 04/04/2020    Earlie Counts, PT 06/20/20 5:08 PM    [place] at [facility] [address], [place], [place],  [address] Phone: [phone]   Fax:  [phone]  Name: Daviona Herbert MRN: [id number] Date of Birth: January 17, 2001 PHYSICAL THERAPY DISCHARGE SUMMARY  Visits from Start of Care: 1  Current functional level related to goals / functional outcomes: See above. Patient has no-showed for her last 2 appointments.    Remaining deficits: See above.    Education / Equipment: HEP Plan:                                                    Patient goals were not met. Patient is being discharged due to not returning since the last visit.  Thank you for the referral. Earlie Counts, PT 07/04/20 4:46 PM  ?????

## 2020-06-20 NOTE — Patient Instructions (Signed)
Access Code: [email or web address] URL: [email or web address] Date: 06/20/2020 Prepared by: Eulis Foster  Exercises Quadruped Transversus Abdominis Bracing - 1 x daily - 7 x weekly - 1 sets - 10 reps Child's Pose Stretch - 1 x daily - 7 x weekly - 1 sets - 1 reps - 30 sec hold Child's Pose with Sidebending - 1 x daily - 7 x weekly - 2 sets - 2 reps - 30 sec hold Standing Posterior Pelvic Tilt - 1 x daily - 7 x weekly - 1 sets - 10 reps Eulis Foster, PT [facility] [address] [place], [place] [address] W: [phone] [email or web address]

## 2020-06-26 ENCOUNTER — Other Ambulatory Visit: Payer: Medicaid Other

## 2020-06-26 ENCOUNTER — Ambulatory Visit (INDEPENDENT_AMBULATORY_CARE_PROVIDER_SITE_OTHER): Payer: Medicaid Other | Admitting: Advanced Practice Midwife

## 2020-06-26 ENCOUNTER — Encounter: Payer: Self-pay | Admitting: Obstetrics

## 2020-06-26 ENCOUNTER — Other Ambulatory Visit: Payer: Self-pay

## 2020-06-26 VITALS — BP 118/66 | HR 81 | Wt 181.0 lb

## 2020-06-26 DIAGNOSIS — Z3A27 27 weeks gestation of pregnancy: Secondary | ICD-10-CM

## 2020-06-26 DIAGNOSIS — Z3402 Encounter for supervision of normal first pregnancy, second trimester: Secondary | ICD-10-CM

## 2020-06-26 DIAGNOSIS — Z23 Encounter for immunization: Secondary | ICD-10-CM

## 2020-06-26 DIAGNOSIS — M549 Dorsalgia, unspecified: Secondary | ICD-10-CM

## 2020-06-26 DIAGNOSIS — O99891 Other specified diseases and conditions complicating pregnancy: Secondary | ICD-10-CM

## 2020-06-26 NOTE — Progress Notes (Signed)
PRENATAL VISIT NOTE  Subjective:  Donna Simon is a 19 y.o. G1P0 at [redacted]w[redacted]d being seen today for ongoing prenatal care.  She is currently monitored for the following issues for this low-risk pregnancy and has Encounter for supervision of normal first pregnancy in second trimester; Back pain in pregnancy; and Echogenic intracardiac focus of fetus on prenatal ultrasound on their problem list.  Patient reports no bleeding, no contractions, no cramping and no leaking.  Contractions: Irritability. Vag. Bleeding: None.  Movement: Present. Denies leaking of fluid.   The following portions of the patient's history were reviewed and updated as appropriate: allergies, current medications, past family history, past medical history, past social history, past surgical history and problem list.   Objective:   Vitals:   06/26/20 0904  BP: 118/66  Pulse: 81  Weight: 82.1 kg    Fetal Status: Fetal Heart Rate (bpm): 138 Fundal Height: 28 cm Movement: Present     General:  Alert, oriented and cooperative. Patient is in no acute distress.  Skin: Skin is warm and dry. No rash noted.   Cardiovascular: Normal heart rate noted  Respiratory: Normal respiratory effort, no problems with respiration noted  Abdomen: Soft, gravid, appropriate for gestational age.  Pain/Pressure: Present     Pelvic: Cervical exam deferred        Extremities: Normal range of motion.  Edema: None  Mental Status: Normal mood and affect. Normal behavior. Normal judgment and thought content.   Assessment and Plan:  Pregnancy: G1P0 at [redacted]w[redacted]d 1. Encounter for supervision of normal first pregnancy in second trimester -- Education and consent for water birth   2. [redacted] weeks gestation of pregnancy --GTT 2 hour  3. Back pain in pregnancy -- Patient reports back pain has resolved with stretches from rehabilitation  Preterm labor symptoms and general obstetric precautions including but not limited to vaginal bleeding, contractions,  leaking of fluid and fetal movement were reviewed in detail with the patient. Please refer to After Visit Summary for other counseling recommendations.   Return in about 2 weeks (around 07/10/2020).  Future Appointments  Date Time Provider Department Center  06/27/2020  4:00 PM Theressa Millard, Devol [facility]  07/04/2020  4:00 PM Theressa Millard, PT [facility]  07/11/2020  4:00 PM Theressa Millard, PT [facility]  07/25/2020  4:00 PM Theressa Millard, PT [facility]    Sande Rives, Student-MidWife   CNM attestation:  I have seen and examined this patient; I agree with above documentation in the midwife student's note.   Donna Simon is a 19 y.o. G1P0 in the [facility] office for routine prenatal visit for low risk pregnancy. See problem list below. +FM, denies LOF, VB, contractions, vaginal discharge.  Patient Active Problem List   Diagnosis Date Noted  . Echogenic intracardiac focus of fetus on prenatal ultrasound 05/08/2020  . Encounter for supervision of normal first pregnancy in second trimester 04/04/2020  . Back pain in pregnancy 04/04/2020     ROS, labs, PMH reviewed  PE: BP 118/66   Pulse 81   Wt 181 lb (82.1 kg)   LMP 12/15/2019   BMI 30.12 kg/m  Gen: calm comfortable, well appearing Resp: normal effort, no distress Abd: gravid appropriate for gestational age  Fundal height: 28 cm FHT by doppler: 138  Plan: - fetal kick counts reinforced,  preterm labor precautions --Waterbirth consent completed today by CNM, pt to send class certificate by MyChart    1. Encounter for supervision  of normal first pregnancy in second trimester   2. [redacted] weeks gestation of pregnancy  - Glucose Tolerance, 2 Hours w/1 Hour - RPR - HIV antibody (with reflex) - CBC - Tdap vaccine greater than or equal to 7yo IM - Flu Vaccine QUAD 36+ mos IM (Fluarix, Quad PF)  3. Back pain in pregnancy --Resolved with PT    Sharen Counter, CNM 10:25 AM

## 2020-06-26 NOTE — Progress Notes (Signed)
[name]/GTT. FLU vaccine given in RD, TDAP in LD, tolerated well.  C/oof gaining too much weight.

## 2020-06-26 NOTE — Patient Instructions (Signed)
Third Trimester of Pregnancy  The third trimester is from week 28 through week 40 (months 7 through 9). This trimester is when your unborn baby (fetus) is growing very fast. At the end of the ninth month, the unborn baby is about 20 inches in length. It weighs about 6-10 pounds. Follow these instructions at home: Medicines  Take over-the-counter and prescription medicines only as told by your doctor. Some medicines are safe and some medicines are not safe during pregnancy.  Take a prenatal vitamin that contains at least 600 micrograms (mcg) of folic acid.  If you have trouble pooping (constipation), take medicine that will make your stool soft (stool softener) if your doctor approves. Eating and drinking   Eat regular, healthy meals.  Avoid raw meat and uncooked cheese.  If you get low calcium from the food you eat, talk to your doctor about taking a daily calcium supplement.  Eat four or five small meals rather than three large meals a day.  Avoid foods that are high in fat and sugars, such as fried and sweet foods.  To prevent constipation: ? Eat foods that are high in fiber, like fresh fruits and vegetables, whole grains, and beans. ? Drink enough fluids to keep your pee (urine) clear or pale yellow. Activity  Exercise only as told by your doctor. Stop exercising if you start to have cramps.  Avoid heavy lifting, wear low heels, and sit up straight.  Do not exercise if it is too hot, too humid, or if you are in a place of great height (high altitude).  You may continue to have sex unless your doctor tells you not to. Relieving pain and discomfort  Wear a good support bra if your breasts are tender.  Take frequent breaks and rest with your legs raised if you have leg cramps or low back pain.  Take warm water baths (sitz baths) to soothe pain or discomfort caused by hemorrhoids. Use hemorrhoid cream if your doctor approves.  If you develop puffy, bulging veins (varicose  veins) in your legs: ? Wear support hose or compression stockings as told by your doctor. ? Raise (elevate) your feet for 15 minutes, 3-4 times a day. ? Limit salt in your food. Safety  Wear your seat belt when driving.  Make a list of emergency phone numbers, including numbers for family, friends, the hospital, and police and fire departments. Preparing for your baby's arrival To prepare for the arrival of your baby:  Take prenatal classes.  Practice driving to the hospital.  Visit the hospital and tour the maternity area.  Talk to your work about taking leave once the baby comes.  Pack your hospital bag.  Prepare the baby's room.  Go to your doctor visits.  Buy a rear-facing car seat. Learn how to install it in your car. General instructions  Do not use hot tubs, steam rooms, or saunas.  Do not use any products that contain nicotine or tobacco, such as cigarettes and e-cigarettes. If you need help quitting, ask your doctor.  Do not drink alcohol.  Do not douche or use tampons or scented sanitary pads.  Do not cross your legs for long periods of time.  Do not travel for long distances unless you must. Only do so if your doctor says it is okay.  Visit your dentist if you have not gone during your pregnancy. Use a soft toothbrush to brush your teeth. Be gentle when you floss.  Avoid cat litter boxes and soil   used by cats. These carry germs that can cause birth defects in the baby and can cause a loss of your baby (miscarriage) or stillbirth.  Keep all your prenatal visits as told by your doctor. This is important. Contact a doctor if:  You are not sure if you are in labor or if your water has broken.  You are dizzy.  You have mild cramps or pressure in your lower belly.  You have a nagging pain in your belly area.  You continue to feel sick to your stomach, you throw up, or you have watery poop.  You have bad smelling fluid coming from your vagina.  You have  pain when you pee. Get help right away if:  You have a fever.  You are leaking fluid from your vagina.  You are spotting or bleeding from your vagina.  You have severe belly cramps or pain.  You lose or gain weight quickly.  You have trouble catching your breath and have chest pain.  You notice sudden or extreme puffiness (swelling) of your face, hands, ankles, feet, or legs.  You have not felt the baby move in over an hour.  You have severe headaches that do not go away with medicine.  You have trouble seeing.  You are leaking, or you are having a gush of fluid, from your vagina before you are 37 weeks.  You have regular belly spasms (contractions) before you are 37 weeks. Summary  The third trimester is from week 28 through week 40 (months 7 through 9). This time is when your unborn baby is growing very fast.  Follow your doctor's advice about medicine, food, and activity.  Get ready for the arrival of your baby by taking prenatal classes, getting all the baby items ready, preparing the baby's room, and visiting your doctor to be checked.  Get help right away if you are bleeding from your vagina, or you have chest pain and trouble catching your breath, or if you have not felt your baby move in over an hour. This information is not intended to replace advice given to you by your health care provider. Make sure you discuss any questions you have with your health care provider. Document Revised: 11/12/2018 Document Reviewed: 08/27/2016 Elsevier Patient Education  2020 [employer].   How a Baby Grows During Pregnancy  Pregnancy begins when a female's sperm enters a female's egg (fertilization). Fertilization usually happens in one of the tubes (fallopian tubes) that connect the ovaries to the womb (uterus). The fertilized egg moves down the fallopian tube to the uterus. Once it reaches the uterus, it implants into the lining of the uterus and begins to grow. For the first 10  weeks, the fertilized egg is called an embryo. After 10 weeks, it is called a fetus. As the fetus continues to grow, it receives oxygen and nutrients through tissue (placenta) that grows to support the developing baby. The placenta is the life support system for the baby. It provides oxygen and nutrition and removes waste. Learning as much as you can about your pregnancy and how your baby is developing can help you enjoy the experience. It can also make you aware of when there might be a problem and when to ask questions. How long does a typical pregnancy last? A pregnancy usually lasts 280 days, or about 40 weeks. Pregnancy is divided into three periods of growth, also called trimesters: First trimester: 0-12 weeks. Second trimester: 13-27 weeks. Third trimester: 28-40 weeks. The day  when your baby is ready to be born (full term) is your estimated date of delivery. How does my baby develop month by month? First month The fertilized egg attaches to the inside of the uterus. Some cells will form the placenta. Others will form the fetus. The arms, legs, brain, spinal cord, lungs, and heart begin to develop. At the end of the first month, the heart begins to beat. Second month The bones, inner ear, eyelids, hands, and feet form. The genitals develop. By the end of 8 weeks, all major organs are developing. Third month All of the internal organs are forming. Teeth develop below the gums. Bones and muscles begin to grow. The spine can flex. The skin is transparent. Fingernails and toenails begin to form. Arms and legs continue to grow longer, and hands and feet develop. The fetus is about 3 inches (7.6 cm) long. Fourth month The placenta is completely formed. The external sex organs, neck, outer ear, eyebrows, eyelids, and fingernails are formed. The fetus can hear, swallow, and move its arms and legs. The kidneys begin to produce urine. The skin is covered with a white, waxy coating  (vernix) and very fine hair (lanugo). Fifth month The fetus moves around more and can be felt for the first time (quickening). The fetus starts to sleep and wake up and may begin to suck its finger. The nails grow to the end of the fingers. The organ in the digestive system that makes bile (gallbladder) functions and helps to digest nutrients. If your baby is a girl, eggs are present in her ovaries. If your baby is a boy, testicles start to move down into his scrotum. Sixth month The lungs are formed. The eyes open. The brain continues to develop. Your baby has fingerprints and toe prints. Your baby's hair grows thicker. At the end of the second trimester, the fetus is about 9 inches (22.9 cm) long. Seventh month The fetus kicks and stretches. The eyes are developed enough to sense changes in light. The hands can make a grasping motion. The fetus responds to sound. Eighth month All organs and body systems are fully developed and functioning. Bones harden, and taste buds develop. The fetus may hiccup. Certain areas of the brain are still developing. The skull remains soft. Ninth month The fetus gains about  lb (0.23 kg) each week. The lungs are fully developed. Patterns of sleep develop. The fetus's head typically moves into a head-down position (vertex) in the uterus to prepare for birth. The fetus weighs 6-9 lb (2.72-4.08 kg) and is 19-20 inches (48.26-50.8 cm) long. What can I do to have a healthy pregnancy and help my baby develop? General instructions Take prenatal vitamins as directed by your health care provider. These include vitamins such as folic acid, iron, calcium, and vitamin D. They are important for healthy development. Take medicines only as directed by your health care provider. Read labels and ask a pharmacist or your health care provider whether over-the-counter medicines, supplements, and prescription drugs are safe to take during pregnancy. Keep all follow-up  visits as directed by your health care provider. This is important. Follow-up visits include prenatal care and screening tests. How do I know if my baby is developing well? At each prenatal visit, your health care provider will do several different tests to check on your health and keep track of your baby's development. These include: Fundal height and position. Your health care provider will measure your growing belly from your pubic bone to the  top of the uterus using a tape measure. Your health care provider will also feel your belly to determine your baby's position. Heartbeat. An ultrasound in the first trimester can confirm pregnancy and show a heartbeat, depending on how far along you are. Your health care provider will check your baby's heart rate at every prenatal visit. Second trimester ultrasound. This ultrasound checks your baby's development. It also may show your baby's gender. What should I do if I have concerns about my baby's development? Always talk with your health care provider about any concerns that you may have about your pregnancy and your baby. Summary A pregnancy usually lasts 280 days, or about 40 weeks. Pregnancy is divided into three periods of growth, also called trimesters. Your health care provider will monitor your baby's growth and development throughout your pregnancy. Follow your health care provider's recommendations about taking prenatal vitamins and medicines during your pregnancy. Talk with your health care provider if you have any concerns about your pregnancy or your developing baby. This information is not intended to replace advice given to you by your health care provider. Make sure you discuss any questions you have with your health care provider. Document Revised: 11/12/2018 Document Reviewed: 06/04/2017 Elsevier Patient Education  2020 [employer].

## 2020-06-27 ENCOUNTER — Telehealth: Payer: Self-pay | Admitting: Physical Therapy

## 2020-06-27 ENCOUNTER — Encounter: Payer: Medicaid Other | Admitting: Physical Therapy

## 2020-06-27 LAB — CBC
Hematocrit: 34.5 % (ref 34.0–46.6)
Hemoglobin: 11.2 g/dL (ref 11.1–15.9)
MCH: 31.1 pg (ref 26.6–33.0)
MCHC: 32.5 g/dL (ref 31.5–35.7)
MCV: 96 fL (ref 79–97)
Platelets: 228 10*3/uL (ref 150–450)
RBC: 3.6 x10E6/uL — ABNORMAL LOW (ref 3.77–5.28)
RDW: 13 % (ref 11.7–15.4)
WBC: 11.3 10*3/uL — ABNORMAL HIGH (ref 3.4–10.8)

## 2020-06-27 LAB — GLUCOSE TOLERANCE, 2 HOURS W/ 1HR
Glucose, 1 hour: 108 mg/dL (ref 65–179)
Glucose, 2 hour: 94 mg/dL (ref 65–152)
Glucose, Fasting: 70 mg/dL (ref 65–91)

## 2020-06-27 LAB — HIV ANTIBODY (ROUTINE TESTING W REFLEX): HIV Screen 4th Generation wRfx: NONREACTIVE

## 2020-06-27 LAB — RPR: RPR Ser Ql: NONREACTIVE

## 2020-06-27 NOTE — Telephone Encounter (Signed)
Called patient about her missed appointment today at 1600.  Left a message.  Eulis Foster, PT @11 /23/2021@ 4:26 PM

## 2020-07-04 ENCOUNTER — Encounter: Payer: Medicaid Other | Admitting: Physical Therapy

## 2020-07-10 ENCOUNTER — Telehealth (INDEPENDENT_AMBULATORY_CARE_PROVIDER_SITE_OTHER): Payer: Medicaid Other | Admitting: Advanced Practice Midwife

## 2020-07-10 ENCOUNTER — Encounter: Payer: Self-pay | Admitting: Advanced Practice Midwife

## 2020-07-10 VITALS — BP 105/64 | HR 83

## 2020-07-10 DIAGNOSIS — Z3400 Encounter for supervision of normal first pregnancy, unspecified trimester: Secondary | ICD-10-CM

## 2020-07-10 DIAGNOSIS — Z3A29 29 weeks gestation of pregnancy: Secondary | ICD-10-CM

## 2020-07-10 DIAGNOSIS — R12 Heartburn: Secondary | ICD-10-CM

## 2020-07-10 DIAGNOSIS — O26893 Other specified pregnancy related conditions, third trimester: Secondary | ICD-10-CM

## 2020-07-10 MED ORDER — FAMOTIDINE 20 MG PO TABS: 20.0000 mg | ORAL_TABLET | Freq: Two times a day (BID) | ORAL | 2 refills | Status: DC

## 2020-07-10 NOTE — Progress Notes (Signed)
   OBSTETRICS PRENATAL VIRTUAL VISIT ENCOUNTER NOTE  Provider location: Center for [facility] [place]   I connected with Darryl Lent on 07/10/20 at  2:00 PM EST by MyChart Video Encounter at home and verified that I am speaking with the correct person using two identifiers.   I discussed the limitations, risks, security and privacy concerns of performing an evaluation and management service virtually and the availability of in person appointments. I also discussed with the patient that there may be a patient responsible charge related to this service. The patient expressed understanding and agreed to proceed. Subjective:  Donna Simon is a 19 y.o. G1P0 at [redacted]w[redacted]d being seen today for ongoing prenatal care.  She is currently monitored for the following issues for this low-risk pregnancy and has Encounter for supervision of normal pregnancy, antepartum; Back pain in pregnancy; and Echogenic intracardiac focus of fetus on prenatal ultrasound on their problem list.  Patient reports heartburn and nausea.  Contractions: Not present. Vag. Bleeding: None.  Movement: Present. Denies any leaking of fluid.   The following portions of the patient's history were reviewed and updated as appropriate: allergies, current medications, past family history, past medical history, past social history, past surgical history and problem list.   Objective:   Vitals:   07/10/20 1350  BP: 105/64  Pulse: 83    Fetal Status:     Movement: Present     General:  Alert, oriented and cooperative. Patient is in no acute distress.  Respiratory: Normal respiratory effort, no problems with respiration noted  Mental Status: Normal mood and affect. Normal behavior. Normal judgment and thought content.  Rest of physical exam deferred due to type of encounter  Imaging: No results found.  Assessment and Plan:  Pregnancy: G1P0 at [redacted]w[redacted]d 1. [redacted] weeks gestation of pregnancy   2. Supervision of normal first  pregnancy, antepartum --Anticipatory guidance about next visits/weeks of pregnancy given. --Reviewed normal GTT results --Pt planning waterbirth, discussed how midwives are usually in house, sometimes on call from home for waterbirth.   --Next visit in 2 weeks with midwife  3. Heartburn during pregnancy in third trimester --Pt with increase in heartburn and return of n/v in last couple of weeks --Questions answered about protein shakes, which pt may use to replace meals when she is having trouble keeping food down. - famotidine (PEPCID) 20 MG tablet; Take 1 tablet (20 mg total) by mouth 2 (two) times daily.  Dispense: 60 tablet; Refill: 2  Preterm labor symptoms and general obstetric precautions including but not limited to vaginal bleeding, contractions, leaking of fluid and fetal movement were reviewed in detail with the patient. I discussed the assessment and treatment plan with the patient. The patient was provided an opportunity to ask questions and all were answered. The patient agreed with the plan and demonstrated an understanding of the instructions. The patient was advised to call back or seek an in-person office evaluation/go to [facility] at [facility] for any urgent or concerning symptoms. Please refer to After Visit Summary for other counseling recommendations.   I provided 10 minutes of face-to-face time during this encounter.  Return in about 2 weeks (around 07/24/2020).  No future appointments.  Sharen Counter, CNM Center for [employer], [facility]

## 2020-07-10 NOTE — Progress Notes (Incomplete)
Virtual Visit via Telephone Note  I connected with Donna Simon on 07/10/20 at  2:00 PM EST by telephone and verified that I am speaking with the correct person using two identifiers.  Pt c/o N/V and concerned about baby's nutrition. Will protein shakes replace the nutrients she's losing?

## 2020-07-11 ENCOUNTER — Encounter: Payer: Medicaid Other | Admitting: Physical Therapy

## 2020-07-18 ENCOUNTER — Encounter: Payer: Medicaid Other | Admitting: Physical Therapy

## 2020-07-25 ENCOUNTER — Other Ambulatory Visit (HOSPITAL_COMMUNITY)
Admission: RE | Admit: 2020-07-25 | Discharge: 2020-07-25 | Disposition: A | Discharge status: Home / Self Care | Payer: Medicaid Other | Source: Ambulatory Visit

## 2020-07-25 ENCOUNTER — Other Ambulatory Visit: Payer: Self-pay

## 2020-07-25 ENCOUNTER — Encounter: Payer: Medicaid Other | Admitting: Physical Therapy

## 2020-07-25 ENCOUNTER — Ambulatory Visit (INDEPENDENT_AMBULATORY_CARE_PROVIDER_SITE_OTHER): Payer: Medicaid Other

## 2020-07-25 VITALS — BP 117/71 | HR 85 | Wt 188.2 lb

## 2020-07-25 DIAGNOSIS — N898 Other specified noninflammatory disorders of vagina: Secondary | ICD-10-CM | POA: Diagnosis present

## 2020-07-25 DIAGNOSIS — Z3400 Encounter for supervision of normal first pregnancy, unspecified trimester: Secondary | ICD-10-CM

## 2020-07-25 DIAGNOSIS — Z3A31 31 weeks gestation of pregnancy: Secondary | ICD-10-CM | POA: Diagnosis present

## 2020-07-25 MED ORDER — MISC. DEVICES MISC: 0 refills | Status: DC

## 2020-07-25 NOTE — Patient Instructions (Addendum)
Safe Medications in Pregnancy   Acne: Benzoyl Peroxide Salicylic Acid  Backache/Headache: Tylenol: 2 regular strength every 4 hours OR              2 Extra strength every 6 hours  Colds/Coughs/Allergies: Benadryl (alcohol free) 25 mg every 6 hours as needed Breath right strips Claritin Cepacol throat lozenges Chloraseptic throat spray Cold-Eeze- up to three times per day Cough drops, alcohol free Flonase (by prescription only) Guaifenesin Mucinex Robitussin DM (plain only, alcohol free) Saline nasal spray/drops Sudafed (pseudoephedrine) & Actifed ** use only after [redacted] weeks gestation and if you do not have high blood pressure Tylenol Vicks Vaporub Zinc lozenges Zyrtec   Constipation: Colace Ducolax suppositories Fleet enema Glycerin suppositories Metamucil Milk of magnesia Miralax Senokot Smooth move tea  Diarrhea: Kaopectate Imodium A-D  *NO pepto Bismol  Hemorrhoids: Anusol Anusol HC Preparation H Tucks  Indigestion: Tums Maalox Mylanta Zantac  Pepcid  Insomnia: Benadryl (alcohol free) 25mg  every 6 hours as needed Tylenol PM Unisom, no Gelcaps  Leg Cramps: Tums MagGel  Nausea/Vomiting:  Bonine Dramamine Emetrol Ginger extract Sea bands Meclizine  Nausea medication to take during pregnancy:  Unisom (doxylamine succinate 25 mg tablets) Take one tablet daily at bedtime. If symptoms are not adequately controlled, the dose can be increased to a maximum recommended dose of two tablets daily (1/2 tablet in the morning, 1/2 tablet mid-afternoon and one at bedtime). Vitamin B6 100mg  tablets. Take one tablet twice a day (up to 200 mg per day).  Skin Rashes: Aveeno products Benadryl cream or 25mg  every 6 hours as needed Calamine Lotion 1% cortisone cream  Yeast infection: Gyne-lotrimin 7 Monistat 7  Gum/tooth pain: Anbesol  **If taking multiple medications, please check labels to avoid duplicating the same active ingredients **take  medication as directed on the label ** Do not exceed 4000 mg of tylenol in 24 hours **Do not take medications that contain aspirin or ibuprofen     Fiber Content in Foods  See the following list for the dietary fiber content of some common foods. High-fiber foods High-fiber foods contain 4 grams or more (4g or more) of fiber per serving. They include:  Artichoke (fresh) -- 1 medium has 10.3g of fiber.  Baked beans, plain or vegetarian (canned) --  cup has 5.2g of fiber.  Blackberries or raspberries (fresh) --  cup has 4g of fiber.  Bran cereal --  cup has 8.6g of fiber.  Bulgur (cooked) --  cup has 4g of fiber.  Kidney beans (canned) --  cup has 6.8g of fiber.  Lentils (cooked) --  cup has 7.8g of fiber.  Pear (fresh) -- 1 medium has 5.1g of fiber.  Peas (frozen) --  cup has 4.4g of fiber.  Pinto beans (canned) --  cup has 5.5g of fiber.  Pinto beans (dried and cooked) --  cup has 7.7g of fiber.  Potato with skin (baked) -- 1 medium has 4.4g of fiber.  Quinoa (cooked) --  cup has 5g of fiber.  Soybeans (canned, frozen, or fresh) --  cup has 5.1g of fiber. Moderate-fiber foods Moderate-fiber foods contain 1-4 grams (1-4g) of fiber per serving. They include:  Almonds -- 1 oz. has 3.5g of fiber.  Apple with skin -- 1 medium has 3.3g of fiber.  Applesauce, sweetened --  cup has 1.5g of fiber.  Bagel, plain -- one 4-inch (10-cm) bagel has 2g of fiber.  Banana -- 1 medium has 3.1g of fiber.  Broccoli (cooked) --  cup has 2.5g  of fiber.  Carrots (cooked) --  cup has 2.3g of fiber.  Corn (canned or frozen) --  cup has 2.1g of fiber.  Corn tortilla -- one 6-inch (15-cm) tortilla has 1.5g of fiber.  Green beans (canned) --  cup has 2g of fiber.  Instant oatmeal --  cup has about 2g of fiber.  Long-grain brown rice (cooked) -- 1 cup has 3.5g of fiber.  Macaroni, enriched (cooked) -- 1 cup has 2.5g of fiber.  Melon -- 1 cup has 1.4g of  fiber.  Multigrain cereal --  cup has about 2-4g of fiber.  Orange -- 1 small has 3.1g of fiber.  Potatoes, mashed --  cup has 1.6g of fiber.  Raisins -- 1/4 cup has 1.6g of fiber.  Squash --  cup has 2.9g of fiber.  Sunflower seeds --  cup has 1.1g of fiber.  Tomato -- 1 medium has 1.5g of fiber.  Vegetable or soy patty -- 1 has 3.4g of fiber.  Whole-wheat bread -- 1 slice has 2g of fiber.  Whole-wheat spaghetti --  cup has 3.2g of fiber. Low-fiber foods Low-fiber foods contain less than 1 gram (less than 1g) of fiber per serving. They include:  Egg -- 1 large.  Flour tortilla -- one 6-inch (15-cm) tortilla.  Fruit juice --  cup.  Lettuce -- 1 cup.  Meat, poultry, or fish -- 1 oz.  Milk -- 1 cup.  Spinach (raw) -- 1 cup.  White bread -- 1 slice.  White rice --  cup.  Yogurt --  cup. Actual amounts of fiber in foods may be different depending on processing. Talk with your dietitian about how much fiber you need in your diet. This information is not intended to replace advice given to you by your health care provider. Make sure you discuss any questions you have with your health care provider. Document Revised: 03/14/2016 Document Reviewed: 09/14/2015 Elsevier Patient Education  2020 [employer].

## 2020-07-25 NOTE — Progress Notes (Signed)
Pt reports fetal movement and reports recently having some irritability with discharge and odor.

## 2020-07-25 NOTE — Progress Notes (Signed)
° °  LOW-RISK PREGNANCY OFFICE VISIT  Patient name: Donna Simon MRN [id number]  Date of birth: [birth year] Chief Complaint:   Routine Prenatal Visit  Subjective:   Donna Simon is a 19 y.o. G1P0 female at [redacted]w[redacted]d with an Estimated Date of Delivery: 09/20/20 being seen today for ongoing management of a low-risk pregnancy aeb has Encounter for supervision of normal pregnancy, antepartum; Back pain in pregnancy; and Echogenic intracardiac focus of fetus on prenatal ultrasound on their problem list.  Patient presents today with complaint of vaginal discharge, odor, and pressure. She reports her symptoms started 2-3 days ago.  She denies recent sexual activity.  She states sex throughout the pregnancy has been uncomfortable and therefore she has not been engaging. She endorses fetal movement and abdominal contractions.  She reports abdominal cramping that occurs "throughout the day." Patient denies vaginal leaking of fluid and bleeding.  Contractions: Irritability. Vag. Bleeding: None.  Movement: Present.  Reviewed past medical,surgical, social, obstetrical and family history as well as problem list, medications and allergies.  Objective   Vitals:   07/25/20 1042  BP: 117/71  Pulse: 85  Weight: 188 lb 3.2 oz (85.4 kg)  Body mass index is 31.32 kg/m.  Total Weight Gain:17 lb 3.2 oz (7.802 kg)         Physical Examination:   General appearance: Well appearing, and in no distress  Mental status: Alert, oriented to person, place, and time  Skin: Warm & dry  Cardiovascular: Normal heart rate noted  Respiratory: Normal respiratory effort, no distress  Abdomen: Soft, gravid, nontender, AGA with Fundal height of Fundal Height: 31 cm  Pelvic: Cervical exam performed Speculum Exam: NEFG.  Small amt thin white questionably curdy discharge.  No apparent odor.  Cervix appears closed.  Polyp noted.  CV collected.  Dilation: Closed Effacement (%): Thick Station: Ballotable Presentation:  Vertex  Extremities: Edema: None  Fetal Status: Fetal Heart Rate (bpm): 130  Movement: Present  No results found for this or any previous visit (from the past 24 hour(s)).  Assessment & Plan:  Low-risk pregnancy of a 19 y.o., G1P0 at [redacted]w[redacted]d with an Estimated Date of Delivery: 09/20/20   1. Supervision of normal first pregnancy, antepartum -Anticipatory guidance for upcoming appts. -Reviewed initial ultrasound and informed that no other [place] will be provided. -Informed of fetal sutures palpated during exam.  2. [redacted] weeks gestation of pregnancy -Discussed complaints and concerns. -Reviewed usage of maternity belt and rx sent. -Reassured that normal to have pelvic pressure and measurements are appropriate.  3. Vaginal odor -CV collected     Meds:  Meds ordered this encounter  Medications   Misc. Devices MISC    Sig: Dispense one maternity belt for patient    Dispense:  1 Device    Refill:  0    Large   Labs/procedures today:  Lab Orders  No laboratory test(s) ordered today     Reviewed: Preterm labor symptoms and general obstetric precautions including but not limited to vaginal bleeding, contractions, leaking of fluid and fetal movement were reviewed in detail with the patient.  All questions were answered.  Follow-up: Return in about 2 weeks (around 08/08/2020) for Virtual LR-[name].  No orders of the defined types were placed in this encounter.  Cherre Robins MSN, CNM 07/25/2020\

## 2020-07-26 LAB — CERVICOVAGINAL ANCILLARY ONLY
Bacterial Vaginitis (gardnerella): NEGATIVE
Candida Glabrata: NEGATIVE
Candida Vaginitis: NEGATIVE
Chlamydia: NEGATIVE
Comment: NEGATIVE
Comment: NEGATIVE
Comment: NEGATIVE
Comment: NEGATIVE
Comment: NEGATIVE
Comment: NORMAL
Neisseria Gonorrhea: NEGATIVE
Trichomonas: NEGATIVE

## 2020-08-05 NOTE — L&D Delivery Note (Addendum)
OB/GYN Faculty Practice Delivery Note  Donna Simon is a 20 y.o. G1P0 at [redacted]w[redacted]d. She was admitted for IOL for non-reassuring NST.   ROM: 11h 69m with clear fluid GBS Status: neg Maximum Maternal Temperature: 99*F  Labor Progress: . Patient induced for decelerations seen on monitoring while in the MAU. Patient received a foley balloon, then pitocin and AROM at 11 AM. Patient progressed to complete with pitocin.  Delivery Date/Time: September 01, 2020 at 2233 Delivery: Called to room and patient was complete and pushing. Head delivered LOA with compound hand. No nuchal cord present. Shoulder and body delivered in usual fashion. Infant with spontaneous cry, placed on mother's abdomen, dried and stimulated. Cord clamped x 2 after 1-minute delay, and cut by FOB. Cord blood drawn. Placenta delivered spontaneously with gentle cord traction. Fundus firm with massage and Pitocin. Labia, perineum, vagina, and cervix inspected with no lacerations.   Placenta: Spontaneous, intact, 3VC Complications: none Lacerations: none EBL: 50cc Analgesia: epidural  Infant: female  APGARs 8,9  weight pending per medical chart  to newborn care/skin to skin   Shirlean Mylar, MD [facility], PGY-1 09/01/2020, 11:00 PM   I was present and scrubbed for the entirety of the delivery. I agree with the findings and the plan of care as documented in the resident's note.  Casper Harrison, MD [employer] [facility] [facility] Family [facility] Fellow, [facility] for [facility], [facility]

## 2020-08-08 ENCOUNTER — Ambulatory Visit (INDEPENDENT_AMBULATORY_CARE_PROVIDER_SITE_OTHER): Payer: Medicaid Other

## 2020-08-08 ENCOUNTER — Other Ambulatory Visit: Payer: Self-pay

## 2020-08-08 VITALS — BP 118/72 | HR 75 | Wt 195.0 lb

## 2020-08-08 DIAGNOSIS — Z3400 Encounter for supervision of normal first pregnancy, unspecified trimester: Secondary | ICD-10-CM

## 2020-08-08 DIAGNOSIS — Z3A33 33 weeks gestation of pregnancy: Secondary | ICD-10-CM

## 2020-08-08 NOTE — Progress Notes (Signed)
[name] 33wk  CC:  Decreased fetal movement in the morning more in afternoons. pt has not tried fetal kick counts.

## 2020-08-08 NOTE — Patient Instructions (Signed)

## 2020-08-08 NOTE — Progress Notes (Signed)
LOW-RISK PREGNANCY OFFICE VISIT  Patient name: Donna Simon MRN Simon  Date of birth: 2000-09-12 Chief Complaint:   Routine Prenatal Visit  Subjective:   Donna Simon is a 20 y.o. G1P0 female at [redacted]w[redacted]d with an Estimated Date of Delivery: 09/20/20 being seen today for ongoing management of a low-risk pregnancy aeb has Encounter for supervision of normal pregnancy, antepartum; Back pain in pregnancy; and Echogenic intracardiac focus of fetus on prenatal ultrasound on their problem list.  Patient presents today with complaint of decreased fetal movement.  She informs provider that movement is present, but states that she thought that the further along she got the more movement she would feel and this is not the case.  She also reports that baby does not have "big movements." She states she is having sharp pains in the upper abdominal area when laying down despite position changes and pregnancy pillows.  She also reports some pelvic pressure.  Patient endorses abdominal cramping and BH contractions. Patient denies vaginal concerns including abnormal discharge, leaking of fluid, and bleeding.  Contractions: Irritability. Vag. Bleeding: None.  Movement: (!) Decreased.  Reviewed past medical,surgical, social, obstetrical and family history as well as problem list, medications and allergies.  Objective   Vitals:   08/08/20 1618  BP: 118/72  Pulse: 75  Weight: 195 lb (88.5 kg)  Body mass index is 32.45 kg/m.  Total Weight Gain:24 lb (10.9 kg)         Physical Examination:   General appearance: Well appearing, and in no distress  Mental status: Alert, oriented to person, place, and time  Skin: Warm & dry  Cardiovascular: Normal heart rate noted  Respiratory: Normal respiratory effort, no distress  Abdomen: Soft, gravid, nontender, AGA with Fundal height of Fundal Height: 34 cm  Pelvic: Cervical exam deferred           Extremities: Edema: Trace  Fetal Status: Fetal Heart Rate  (bpm): 145  Movement: (!) Decreased   No results found for this or any previous visit (from the past 24 hour(s)).  Assessment & Plan:  Low-risk pregnancy of a 20 y.o., G1P0 at [redacted]w[redacted]d with an Estimated Date of Delivery: 09/20/20   1. Supervision of normal first pregnancy, antepartum -Anticipatory guidance for upcoming appts.  -Educated on GBS bacteria including what it is, why we test, and how and when we treat if needed.   2. [redacted] weeks gestation of pregnancy -Reviewed labor planning including desire for WB and IOL. -Informed that if she is induced it is unlikely that WB will be an option.  However, it is something that is evaluated throughout the labor process. -Discussed c/o fetal movement and when it is of concern.  Informed that fetus does not move more as pregnancy progresses d/t space limitations. -Further informed that movement may seem more prominent due to larger fetal size, but this is also not guaranteed. -Encouraged to identify fetal movement patterns and notify provider/office of deviations from this activity. -Reassured that pelvic pain and discomfort is anticipated complaint during this stage of pregnancy.      Meds: No orders of the defined types were placed in this encounter.  Labs/procedures today:  Lab Orders  No laboratory test(s) ordered today     Reviewed: Preterm labor symptoms and general obstetric precautions including but not limited to vaginal bleeding, contractions, leaking of fluid and fetal movement were reviewed in detail with the patient.  All questions were answered.  Follow-up: Return in about 3 weeks (around 08/29/2020) for LROB  with GBS.  No orders of the defined types were placed in this encounter.  Cherre Robins MSN, CNM 08/09/2020

## 2020-08-14 ENCOUNTER — Inpatient Hospital Stay (HOSPITAL_COMMUNITY)
Admission: AD | Admit: 2020-08-14 | Discharge: 2020-08-14 | Disposition: A | Discharge status: Home / Self Care | Payer: Medicaid Other | Attending: Family Medicine | Admitting: Family Medicine

## 2020-08-14 ENCOUNTER — Other Ambulatory Visit: Payer: Self-pay

## 2020-08-14 ENCOUNTER — Encounter (HOSPITAL_COMMUNITY): Payer: Self-pay | Admitting: Family Medicine

## 2020-08-14 ENCOUNTER — Inpatient Hospital Stay (HOSPITAL_BASED_OUTPATIENT_CLINIC_OR_DEPARTMENT_OTHER): Payer: Medicaid Other

## 2020-08-14 DIAGNOSIS — M549 Dorsalgia, unspecified: Secondary | ICD-10-CM

## 2020-08-14 DIAGNOSIS — Z3400 Encounter for supervision of normal first pregnancy, unspecified trimester: Secondary | ICD-10-CM

## 2020-08-14 DIAGNOSIS — M545 Low back pain, unspecified: Secondary | ICD-10-CM | POA: Insufficient documentation

## 2020-08-14 DIAGNOSIS — O283 Abnormal ultrasonic finding on antenatal screening of mother: Secondary | ICD-10-CM

## 2020-08-14 DIAGNOSIS — O368130 Decreased fetal movements, third trimester, not applicable or unspecified: Secondary | ICD-10-CM

## 2020-08-14 DIAGNOSIS — O368190 Decreased fetal movements, unspecified trimester, not applicable or unspecified: Secondary | ICD-10-CM

## 2020-08-14 DIAGNOSIS — O26893 Other specified pregnancy related conditions, third trimester: Secondary | ICD-10-CM

## 2020-08-14 DIAGNOSIS — Z3A34 34 weeks gestation of pregnancy: Secondary | ICD-10-CM | POA: Diagnosis not present

## 2020-08-14 DIAGNOSIS — N898 Other specified noninflammatory disorders of vagina: Secondary | ICD-10-CM | POA: Diagnosis not present

## 2020-08-14 DIAGNOSIS — Z3689 Encounter for other specified antenatal screening: Secondary | ICD-10-CM

## 2020-08-14 DIAGNOSIS — O99891 Other specified diseases and conditions complicating pregnancy: Secondary | ICD-10-CM

## 2020-08-14 LAB — WET PREP, GENITAL
Clue Cells Wet Prep HPF POC: NONE SEEN
Sperm: NONE SEEN
Trich, Wet Prep: NONE SEEN
Yeast Wet Prep HPF POC: NONE SEEN

## 2020-08-14 LAB — URINALYSIS, ROUTINE W REFLEX MICROSCOPIC
Bilirubin Urine: NEGATIVE
Glucose, UA: NEGATIVE mg/dL
Hgb urine dipstick: NEGATIVE
Ketones, ur: NEGATIVE mg/dL
Leukocytes,Ua: NEGATIVE
Nitrite: NEGATIVE
Protein, ur: NEGATIVE mg/dL
Specific Gravity, Urine: 1.015 (ref 1.005–1.030)
pH: 6 (ref 5.0–8.0)

## 2020-08-14 NOTE — Discharge Instructions (Signed)
Fetal Movement Counts Patient Name: ________________________________________________ Patient Due Date: ____________________  What is a fetal movement count? A fetal movement count is the number of times that you feel your baby move during a certain amount of time. This may also be called a fetal kick count. A fetal movement count is recommended for every pregnant woman. You may be asked to start counting fetal movements as early as week 28 of your pregnancy. Pay attention to when your baby is most active. You may notice your baby's sleep and wake cycles. You may also notice things that make your baby move more. You should do a fetal movement count:  When your baby is normally most active.  At the same time each day. A good time to count movements is while you are resting, after having something to eat and drink. How do I count fetal movements? 1. Find a quiet, comfortable area. Sit, or lie down on your side. 2. Write down the date, the start time and stop time, and the number of movements that you felt between those two times. Take this information with you to your health care visits. 3. Write down your start time when you feel the first movement. 4. Count kicks, flutters, swishes, rolls, and jabs. You should feel at least 10 movements. 5. You may stop counting after you have felt 10 movements, or if you have been counting for 2 hours. Write down the stop time. 6. If you do not feel 10 movements in 2 hours, contact your health care provider for further instructions. Your health care provider may want to do additional tests to assess your baby's well-being. Contact a health care provider if:  You feel fewer than 10 movements in 2 hours.  Your baby is not moving like he or she usually does. Date: ____________ Start time: ____________ Stop time: ____________ Movements: ____________ Date: ____________ Start time: ____________ Stop time: ____________ Movements: ____________ Date: ____________  Start time: ____________ Stop time: ____________ Movements: ____________ Date: ____________ Start time: ____________ Stop time: ____________ Movements: ____________ Date: ____________ Start time: ____________ Stop time: ____________ Movements: ____________ Date: ____________ Start time: ____________ Stop time: ____________ Movements: ____________ Date: ____________ Start time: ____________ Stop time: ____________ Movements: ____________ Date: ____________ Start time: ____________ Stop time: ____________ Movements: ____________ Date: ____________ Start time: ____________ Stop time: ____________ Movements: ____________ This information is not intended to replace advice given to you by your health care provider. Make sure you discuss any questions you have with your health care provider. Document Revised: 03/11/2019 Document Reviewed: 03/11/2019 Elsevier Patient Education  2021 Elsevier Inc.  

## 2020-08-14 NOTE — MAU Provider Note (Addendum)
°History  °  °CSN: [id number] ° °Arrival date and time: 08/14/20 1304 ° ° Event Date/Time  ° First Provider Initiated Contact with Patient 08/14/20 1427   °  ° °Chief Complaint  °Patient presents with  °• Decreased Fetal Movement  °• Vaginal Discharge  ° °HPI °20 y.o G1 at gestational age [redacted]w[redacted]d by LMP. Patient reports noticing decreased fetal movements starting 4 days ago. Previous to 4 days ago she estimated feeling 15 movements per day although never documented with increased activity at night. Since then she has not noticed as much movement and activities that use to cause activity (e.g. eating ice cream) are no longer causing activity. She reports 2 days ago she did not feel baby move but has felt movement since. She has felt baby move today. Patient reports having increased abdominal pressure. She denies blood from vagina, contractions or previous decreased fetal movements. ° °Patient also reports having lower back pain that she has had intermittently throughout her pregnancy but worsening yesterday after increased activity. She reports that the pain is located on the tailbone and yesterday was radiating pain to the lower abdomen. The radiating pain has since resolved. She also has numbness and tingling of bilateral legs with R>L. She reports that walking up steps makes the pain worse but bending over makes pain better. She denies numbness of vagina or anus or fecal incontinence. She denies recent or previous trauma to back.  ° °She reports having about 2 weeks of increasing yellow mucous discharge from the vagina with mildlu "unpleasant" odor. She denies frothy appearance, cottage cheese appearance, dysuria, fever, or blood in discharge. She reports pain with intercourse to the point that is has prevented her from having intercourse. Patient had negative work-up about 10 days ago for STI. In a monogamous relationship. ° °Patient has had routine PNC with reassuring USs and genetic testing. Pregnancy  uncomplicated thus far. ° °OB History   ° Gravida  °1  ° Para  °   ° Term  °   ° Preterm  °   ° AB  °   ° Living  °   °  ° SAB  °   ° IAB  °   ° Ectopic  °   ° Multiple  °   ° Live Births  °   °   °  °  ° ° °History reviewed. No pertinent past medical history. ° °Past Surgical History:  °Procedure Laterality Date  °• APPENDECTOMY Right 2009  ° ° °History reviewed. No pertinent family history. ° °Social History  ° °Tobacco Use  °• Smoking status: Never Smoker  °• Smokeless tobacco: Never Used  °Substance Use Topics  °• Alcohol use: Not Currently  °• Drug use: Never  ° ° °Allergies: No Known Allergies ° °Medications Prior to Admission  °Medication Sig Dispense Refill Last Dose  °• Blood Pressure Monitoring (BLOOD PRESSURE KIT) DEVI 1 Device by Does not apply route as needed. 1 each 0   °• famotidine (PEPCID) 20 MG tablet Take 1 tablet (20 mg total) by mouth 2 (two) times daily. (Patient not taking: Reported on 08/08/2020) 60 tablet 2   °• Misc. Devices MISC Dispense one maternity belt for patient 1 Device 0   °• Prenatal Vit-Fe Fumarate-FA (PRENATAL MULTIVITAMIN) TABS tablet Take 1 tablet by mouth daily at 12 noon.     ° ° °Review of Systems  °Respiratory: Negative for cough and shortness of breath.   °Gastrointestinal: Negative for abdominal pain, diarrhea, nausea,   nausea, rectal pain and vomiting.  Genitourinary: Positive for frequency and vaginal discharge. Negative for difficulty urinating, dysuria, flank pain and hematuria.  Musculoskeletal: Positive for back pain.  Neurological: Negative for headaches.   Physical Exam   Blood pressure 118/60, pulse 87, temperature 98.2 F (36.8 C), temperature source Oral, resp. rate 16, last menstrual period 12/15/2019, SpO2 99 %.  Physical Exam Constitutional:      Appearance: Normal appearance. She is not toxic-appearing.  Cardiovascular:     Rate and Rhythm: Normal rate.  Pulmonary:     Effort: Pulmonary effort is normal.  Genitourinary:    General: Normal vulva.   Musculoskeletal:        General: No tenderness.     Comments: No spinal or paraspinal tenderness or overlying bruises  Neurological:     Mental Status: She is alert and oriented to person, place, and time.  Psychiatric:        Mood and Affect: Mood normal.        Behavior: Behavior normal.     MAU Course  Procedures  MDM  Decreased Fetal Movement-  FHT with reactive non stress test here with baseline rate 130, moderate variability, and 2+ accelerations w/o declarations at low risk for acidosis and low risk for poor fetal outcome in the next 3-5 days. [place] here reassuring. Discussed with patient baby has less room to move as pregnancy goes on and may not feel as active as before.  Odorous Vaginal Discharge- Though patient had negative work-up of STI about 10 days ago and low risk intercourse with monogamous partner, STI could not be ruled out based on symptoms and patient felt more comfortable having a work-up for STI to r/o. Wet prep negative. GC/Chlamydia results pending. UA negative. Likely mucous plug.   Lower Back Pain-  Patient with intermittent lower back pain since early pregnancy worse with activity yesterday. No red flag symptoms such as fecal incontinence or anal numbness. Most likely related to pregnancy and instructed on sleep position to help with back pain.  Assessment and Plan  Decreased Fetal Movement- - Instructed to monitor fetal movement with average movement 5x/hour - Instructed to drink cold water/juice and go to a quiet room where she can better monitor movement if she does not notice baby move for a few hours, and if at that point she does not notice movement, return here  Mucous Plug- - Discussed that discharge for days/weeks is not uncommon and she can expect to still have more discharge - F/U GC/chlamydia results  Lower Back Pain- - Instructed on helpful sleep positions and supporting back   [employer] 08/14/2020, 4:10 PM

## 2020-08-14 NOTE — MAU Note (Signed)
Donna Simon is a 20 y.o. at [redacted]w[redacted]d here in MAU reporting: DFM for the past 2 days, states patterns seem less then normal. Was having intermittent back pain yesterday but denies back pain at this time. But is reporting lower abdominal pressure. Also reporting yellow discharge with an odor.   Onset of complaint: ongoing  Pain score: 0/10  Vitals:   08/14/20 1334  BP: 118/60  Pulse: 87  Resp: 16  Temp: 98.2 F (36.8 C)  SpO2: 99%     FHT: EFM applied  Lab orders placed from triage: UA

## 2020-08-15 LAB — GC/CHLAMYDIA PROBE AMP (~~LOC~~) NOT AT ARMC
Chlamydia: NEGATIVE
Comment: NEGATIVE
Comment: NORMAL
Neisseria Gonorrhea: NEGATIVE

## 2020-08-23 ENCOUNTER — Other Ambulatory Visit (HOSPITAL_COMMUNITY)
Admission: RE | Admit: 2020-08-23 | Discharge: 2020-08-23 | Disposition: A | Discharge status: Home / Self Care | Payer: Medicaid Other | Source: Ambulatory Visit | Attending: Advanced Practice Midwife | Admitting: Advanced Practice Midwife

## 2020-08-23 ENCOUNTER — Encounter: Payer: Medicaid Other | Admitting: Nurse Practitioner

## 2020-08-23 ENCOUNTER — Ambulatory Visit (INDEPENDENT_AMBULATORY_CARE_PROVIDER_SITE_OTHER): Payer: Medicaid Other | Admitting: Obstetrics and Gynecology

## 2020-08-23 ENCOUNTER — Other Ambulatory Visit: Payer: Self-pay

## 2020-08-23 VITALS — Wt 198.0 lb

## 2020-08-23 DIAGNOSIS — Y92009 Unspecified place in unspecified non-institutional (private) residence as the place of occurrence of the external cause: Secondary | ICD-10-CM | POA: Insufficient documentation

## 2020-08-23 DIAGNOSIS — W19XXXA Unspecified fall, initial encounter: Secondary | ICD-10-CM | POA: Diagnosis not present

## 2020-08-23 DIAGNOSIS — Z3400 Encounter for supervision of normal first pregnancy, unspecified trimester: Secondary | ICD-10-CM

## 2020-08-23 DIAGNOSIS — Z3A36 36 weeks gestation of pregnancy: Secondary | ICD-10-CM

## 2020-08-23 DIAGNOSIS — O368130 Decreased fetal movements, third trimester, not applicable or unspecified: Secondary | ICD-10-CM

## 2020-08-23 DIAGNOSIS — O368131 Decreased fetal movements, third trimester, fetus 1: Secondary | ICD-10-CM | POA: Diagnosis not present

## 2020-08-23 NOTE — Progress Notes (Signed)
   PRENATAL VISIT NOTE  Subjective:  Donna Simon is a 20 y.o. G1P0 at [redacted]w[redacted]d being seen today for ongoing prenatal care.  She is currently monitored for the following issues for this low-risk pregnancy and has Encounter for supervision of normal pregnancy, antepartum; Back pain in pregnancy; Echogenic intracardiac focus of fetus on prenatal ultrasound; [redacted] weeks gestation of pregnancy; Fall at home, initial encounter; and Decreased fetal movement, third trimester, fetus 1 on their problem list.  Patient doing well but she did fall on the ice at home. She reports no complaints.  Contractions: Irritability. Vag. Bleeding: None.  Movement: (!) Decreased. Denies leaking of fluid.   NST performed which was reactive.  Pt notes some fetal movement while on monitor.  Per pt she had finished the waterbirth course and has her certificate.  The following portions of the patient's history were reviewed and updated as appropriate: allergies, current medications, past family history, past medical history, past social history, past surgical history and problem list. Problem list updated.  Objective:   Vitals:   08/23/20 0924  Weight: 198 lb (89.8 kg)    Fetal Status:     Movement: (!) Decreased  Presentation: Vertex  General:  Alert, oriented and cooperative. Patient is in no acute distress.  Skin: Skin is warm and dry. No rash noted.   Cardiovascular: Normal heart rate noted  Respiratory: Normal respiratory effort, no problems with respiration noted  Abdomen: Soft, gravid, appropriate for gestational age.  Pain/Pressure: Present     Pelvic: Cervical exam performed Dilation: Fingertip Effacement (%): 60 Station: -2  Extremities: Normal range of motion.  Edema: Trace  Mental Status:  Normal mood and affect. Normal behavior. Normal judgment and thought content.  NST:  Baseline 125, moderate variability, accels noted, occasional small variable decel, duration: 21 minutes Assessment and Plan:   Pregnancy: G1P0 at [redacted]w[redacted]d  1. Supervision of normal first pregnancy, antepartum  - Strep Gp B NAA - GC/Chlamydia probe amp ([place])not at [facility]  2. [redacted] weeks gestation of pregnancy   3. Fall at home, initial encounter Pt had category 1 strip, reactive, she fell on her bottom and not abdomen, pt advised to be seen at MAU for future MVA or falls.  4. Decreased fetal movement, third trimester, fetus 1 Reactive NST, category 1  Preterm labor symptoms and general obstetric precautions including but not limited to vaginal bleeding, contractions, leaking of fluid and fetal movement were reviewed in detail with the patient.  Please refer to After Visit Summary for other counseling recommendations.   Return in about 1 week (around 08/30/2020) for [name], in person.   Mariel Aloe, MD

## 2020-08-23 NOTE — Patient Instructions (Signed)

## 2020-08-23 NOTE — Progress Notes (Signed)
Pt states she fell this morning on the ice. Also has decreased fetal movement.

## 2020-08-24 LAB — GC/CHLAMYDIA PROBE AMP (~~LOC~~) NOT AT ARMC
Chlamydia: NEGATIVE
Comment: NEGATIVE
Comment: NORMAL
Neisseria Gonorrhea: NEGATIVE

## 2020-08-25 LAB — STREP GP B NAA: Strep Gp B NAA: NEGATIVE

## 2020-08-29 ENCOUNTER — Other Ambulatory Visit: Payer: Self-pay

## 2020-08-29 ENCOUNTER — Ambulatory Visit (INDEPENDENT_AMBULATORY_CARE_PROVIDER_SITE_OTHER): Payer: Medicaid Other | Admitting: Advanced Practice Midwife

## 2020-08-29 ENCOUNTER — Encounter: Payer: Self-pay | Admitting: Advanced Practice Midwife

## 2020-08-29 ENCOUNTER — Encounter: Payer: Medicaid Other | Admitting: Advanced Practice Midwife

## 2020-08-29 VITALS — BP 124/75 | HR 86 | Wt 200.8 lb

## 2020-08-29 DIAGNOSIS — O479 False labor, unspecified: Secondary | ICD-10-CM

## 2020-08-29 DIAGNOSIS — O26849 Uterine size-date discrepancy, unspecified trimester: Secondary | ICD-10-CM

## 2020-08-29 DIAGNOSIS — Z3400 Encounter for supervision of normal first pregnancy, unspecified trimester: Secondary | ICD-10-CM

## 2020-08-29 DIAGNOSIS — Z3A36 36 weeks gestation of pregnancy: Secondary | ICD-10-CM

## 2020-08-29 NOTE — Progress Notes (Signed)
   PRENATAL VISIT NOTE  Subjective:  Donna Simon is a 20 y.o. G1P0 at [redacted]w[redacted]d being seen today for ongoing prenatal care.  She is currently monitored for the following issues for this low-risk pregnancy and has Encounter for supervision of normal pregnancy, antepartum; Back pain in pregnancy; Echogenic intracardiac focus of fetus on prenatal ultrasound; [redacted] weeks gestation of pregnancy; Fall at home, initial encounter; and Decreased fetal movement, third trimester, fetus 1 on their problem list.  Patient reports occasional contractions.  Contractions: Irritability. Vag. Bleeding: None.  Movement: Present. Denies leaking of fluid.   The following portions of the patient's history were reviewed and updated as appropriate: allergies, current medications, past family history, past medical history, past social history, past surgical history and problem list.   Objective:   Vitals:   08/29/20 1345  BP: 124/75  Pulse: 86  Weight: 200 lb 12.8 oz (91.1 kg)    Fetal Status: Fetal Heart Rate (bpm): 154 Fundal Height: 34 cm Movement: Present  Presentation: Vertex  General:  Alert, oriented and cooperative. Patient is in no acute distress.  Skin: Skin is warm and dry. No rash noted.   Cardiovascular: Normal heart rate noted  Respiratory: Normal respiratory effort, no problems with respiration noted  Abdomen: Soft, gravid, appropriate for gestational age.  Pain/Pressure: Present     Pelvic: Cervical exam performed in the presence of a chaperone Dilation: 1 Effacement (%): 70 Station: -1  Extremities: Normal range of motion.  Edema: Trace  Mental Status: Normal mood and affect. Normal behavior. Normal judgment and thought content.   Assessment and Plan:  Pregnancy: G1P0 at [redacted]w[redacted]d 1. Supervision of normal first pregnancy, antepartum --Anticipatory guidance about next visits/weeks of pregnancy given. --Next visit in 1 week in office with midwife --Pt completed waterbirth class and has certificate,  sent as attachment via MyChart  2. [redacted] weeks gestation of pregnancy   3. Braxton Hicks contractions --Pt with painful cramping yesterday and today, not regular but 5-6 times per hour --Cervix 1/70 with low station  4. Uterine size date discrepancy pregnancy --May be due to low station but FH 34 today at [redacted]w[redacted]d --EFW by [place] is 6-6.5 lbs but growth [place] ordered for EFW and to look at AFI - [place] MFM OB FOLLOW UP; Future  Term labor symptoms and general obstetric precautions including but not limited to vaginal bleeding, contractions, leaking of fluid and fetal movement were reviewed in detail with the patient. Please refer to After Visit Summary for other counseling recommendations.   Return in about 1 week (around 09/05/2020).  Future Appointments  Date Time Provider Department Center  09/05/2020  8:35 AM Calvert Cantor, CNM [facility] None    Sharen Counter, [place]

## 2020-08-29 NOTE — Patient Instructions (Signed)
Things to Try After 37 weeks to Encourage Labor/Get Ready for Labor:   1.  Try the Miles Circuit at www.milescircuit.com daily to improve baby's position and encourage the onset of labor.  2. Walk a little and rest a little every day.  Change positions often.  3. Cervical Ripening: May try one or both a. Red Raspberry Leaf capsules or tea:  two 300mg or 400mg tablets with each meal, 2-3 times a day, or 1-3 cups of tea daily  Potential Side Effects Of Raspberry Leaf:  Most women do not experience any side effects from drinking raspberry leaf tea. However, nausea and loose stools are possible   b. Evening Primrose Oil capsules: take 1 capsule by mouth and place one capsule in the vagina every night.    Some of the potential side effects:  Upset stomach  Loose stools or diarrhea  Headaches  Nausea  4. Sex can also help the cervix ripen and encourage labor onset.    Labor Precautions Reasons to come to [facility] at [facility]:  1.  Contractions are  5 minutes apart or less, each last 1 minute, these have been going on for 1-2 hours, and you cannot walk or talk during them 2.  You have a large gush of fluid, or a trickle of fluid that will not stop and you have to wear a pad 3.  You have bleeding that is bright red, heavier than spotting--like menstrual bleeding (spotting can be normal in early labor or after a check of your cervix) 4.  You do not feel the baby moving like he/she normally does 

## 2020-08-29 NOTE — Progress Notes (Signed)
[name] 36 wks  GBS negative   CC:  Braxton Hicks ctx's.  Pt would like a cervix check.

## 2020-08-30 ENCOUNTER — Other Ambulatory Visit: Payer: Self-pay | Admitting: Advanced Practice Midwife

## 2020-08-30 ENCOUNTER — Encounter: Payer: Self-pay | Admitting: *Deleted

## 2020-08-30 ENCOUNTER — Other Ambulatory Visit (HOSPITAL_COMMUNITY): Payer: Self-pay | Admitting: Obstetrics and Gynecology

## 2020-08-30 ENCOUNTER — Telehealth: Payer: Self-pay | Admitting: Advanced Practice Midwife

## 2020-08-30 ENCOUNTER — Ambulatory Visit: Payer: Medicaid Other | Attending: Advanced Practice Midwife

## 2020-08-30 ENCOUNTER — Ambulatory Visit: Payer: Medicaid Other | Admitting: *Deleted

## 2020-08-30 ENCOUNTER — Other Ambulatory Visit: Payer: Self-pay | Admitting: *Deleted

## 2020-08-30 DIAGNOSIS — O26843 Uterine size-date discrepancy, third trimester: Secondary | ICD-10-CM

## 2020-08-30 DIAGNOSIS — O365930 Maternal care for other known or suspected poor fetal growth, third trimester, not applicable or unspecified: Secondary | ICD-10-CM | POA: Diagnosis not present

## 2020-08-30 DIAGNOSIS — O99891 Other specified diseases and conditions complicating pregnancy: Secondary | ICD-10-CM

## 2020-08-30 DIAGNOSIS — Z3A36 36 weeks gestation of pregnancy: Secondary | ICD-10-CM | POA: Insufficient documentation

## 2020-08-30 DIAGNOSIS — O283 Abnormal ultrasonic finding on antenatal screening of mother: Secondary | ICD-10-CM

## 2020-08-30 DIAGNOSIS — O365931 Maternal care for other known or suspected poor fetal growth, third trimester, fetus 1: Secondary | ICD-10-CM | POA: Diagnosis not present

## 2020-08-30 DIAGNOSIS — O26849 Uterine size-date discrepancy, unspecified trimester: Secondary | ICD-10-CM | POA: Insufficient documentation

## 2020-08-30 DIAGNOSIS — O358XX0 Maternal care for other (suspected) fetal abnormality and damage, not applicable or unspecified: Secondary | ICD-10-CM | POA: Diagnosis not present

## 2020-08-30 DIAGNOSIS — M549 Dorsalgia, unspecified: Secondary | ICD-10-CM

## 2020-08-30 NOTE — Telephone Encounter (Signed)
Pt called the office to discuss plan of care for induction.  Pt had [place] today with MFM without final report to review.  I consulted Dr Parke Poisson regarding today's [place] and plan of care.  [place] with growth restriction with overall EFW 15%tile but AC 6%tile.  Dr Parke Poisson recommends IOL at the end of next week, prior to 39 weeks.    I called the patient to discuss POC and she would like to plan with her work the day of the induction if it is being scheduled.  Best day for patient is Thursday, 09/07/20. Message sent to office to schedule IOL on 09/07/20.  Pt to keep appt with Thalia Bloodgood, CNM, and with MFM on 09/05/20.  Labor readiness and labor precautions/reasons to go to the hospital reviewed with pt on the phone today.

## 2020-09-01 ENCOUNTER — Encounter (HOSPITAL_COMMUNITY): Payer: Self-pay | Admitting: Obstetrics and Gynecology

## 2020-09-01 ENCOUNTER — Inpatient Hospital Stay (HOSPITAL_COMMUNITY): Payer: Medicaid Other | Admitting: Anesthesiology

## 2020-09-01 ENCOUNTER — Other Ambulatory Visit: Payer: Self-pay

## 2020-09-01 ENCOUNTER — Inpatient Hospital Stay (HOSPITAL_COMMUNITY)
Admission: AD | Admit: 2020-09-01 | Discharge: 2020-09-03 | DRG: 807 | Disposition: A | Discharge status: Home / Self Care | Payer: Medicaid Other | Attending: Family Medicine | Admitting: Family Medicine

## 2020-09-01 DIAGNOSIS — Z3A37 37 weeks gestation of pregnancy: Secondary | ICD-10-CM

## 2020-09-01 DIAGNOSIS — Z20822 Contact with and (suspected) exposure to covid-19: Secondary | ICD-10-CM | POA: Diagnosis not present

## 2020-09-01 DIAGNOSIS — Z30017 Encounter for initial prescription of implantable subdermal contraceptive: Secondary | ICD-10-CM | POA: Diagnosis not present

## 2020-09-01 DIAGNOSIS — O99891 Other specified diseases and conditions complicating pregnancy: Secondary | ICD-10-CM

## 2020-09-01 DIAGNOSIS — O26893 Other specified pregnancy related conditions, third trimester: Secondary | ICD-10-CM | POA: Diagnosis present

## 2020-09-01 DIAGNOSIS — M549 Dorsalgia, unspecified: Secondary | ICD-10-CM

## 2020-09-01 DIAGNOSIS — O326XX0 Maternal care for compound presentation, not applicable or unspecified: Secondary | ICD-10-CM

## 2020-09-01 DIAGNOSIS — O283 Abnormal ultrasonic finding on antenatal screening of mother: Secondary | ICD-10-CM

## 2020-09-01 LAB — CBC
HCT: 33.5 % — ABNORMAL LOW (ref 36.0–46.0)
Hemoglobin: 11.1 g/dL — ABNORMAL LOW (ref 12.0–15.0)
MCH: 31.1 pg (ref 26.0–34.0)
MCHC: 33.1 g/dL (ref 30.0–36.0)
MCV: 93.8 fL (ref 80.0–100.0)
Platelets: 242 10*3/uL (ref 150–400)
RBC: 3.57 MIL/uL — ABNORMAL LOW (ref 3.87–5.11)
RDW: 14.3 % (ref 11.5–15.5)
WBC: 13.7 10*3/uL — ABNORMAL HIGH (ref 4.0–10.5)
nRBC: 0 % (ref 0.0–0.2)

## 2020-09-01 LAB — RPR: RPR Ser Ql: NONREACTIVE

## 2020-09-01 LAB — SARS CORONAVIRUS 2 BY RT PCR (HOSPITAL ORDER, PERFORMED IN ~~LOC~~ HOSPITAL LAB): SARS Coronavirus 2: NEGATIVE

## 2020-09-01 LAB — TYPE AND SCREEN
ABO/RH(D): A POS
Antibody Screen: NEGATIVE

## 2020-09-01 MED ORDER — LACTATED RINGERS IV SOLN: INTRAVENOUS | Status: DC

## 2020-09-01 MED ORDER — PHENYLEPHRINE 40 MCG/ML (10ML) SYRINGE FOR IV PUSH (FOR BLOOD PRESSURE SUPPORT): 80.0000 ug | PREFILLED_SYRINGE | INTRAVENOUS | Status: DC | PRN

## 2020-09-01 MED ORDER — FENTANYL-BUPIVACAINE-NACL 0.5-0.125-0.9 MG/250ML-% EP SOLN
12.0000 mL/h | EPIDURAL | Status: DC | PRN
Start: 2020-09-01 — End: 2020-09-02
  Administered 2020-09-01 (×2): 12 mL/h via EPIDURAL
  Filled 2020-09-01: qty 250

## 2020-09-01 MED ORDER — SOD CITRATE-CITRIC ACID 500-334 MG/5ML PO SOLN: 30.0000 mL | ORAL | Status: DC | PRN

## 2020-09-01 MED ORDER — OXYCODONE-ACETAMINOPHEN 5-325 MG PO TABS: 1.0000 | ORAL_TABLET | ORAL | Status: DC | PRN

## 2020-09-01 MED ORDER — TERBUTALINE SULFATE 1 MG/ML IJ SOLN: 0.2500 mg | Freq: Once | INTRAMUSCULAR | Status: DC | PRN

## 2020-09-01 MED ORDER — ONDANSETRON HCL 4 MG/2ML IJ SOLN: 4.0000 mg | Freq: Four times a day (QID) | INTRAMUSCULAR | Status: DC | PRN

## 2020-09-01 MED ORDER — LIDOCAINE HCL (PF) 1 % IJ SOLN
INTRAMUSCULAR | Status: DC | PRN
  Administered 2020-09-01 (×2): 4 mL via EPIDURAL

## 2020-09-01 MED ORDER — OXYTOCIN-SODIUM CHLORIDE 30-0.9 UT/500ML-% IV SOLN
2.5000 [IU]/h | INTRAVENOUS | Status: DC
  Administered 2020-09-01: 2.5 [IU]/h via INTRAVENOUS
  Filled 2020-09-01: qty 500

## 2020-09-01 MED ORDER — OXYCODONE-ACETAMINOPHEN 5-325 MG PO TABS: 2.0000 | ORAL_TABLET | ORAL | Status: DC | PRN

## 2020-09-01 MED ORDER — ACETAMINOPHEN 325 MG PO TABS: 650.0000 mg | ORAL_TABLET | ORAL | Status: DC | PRN

## 2020-09-01 MED ORDER — LIDOCAINE HCL (PF) 1 % IJ SOLN: 30.0000 mL | INTRAMUSCULAR | Status: DC | PRN

## 2020-09-01 MED ORDER — LACTATED RINGERS IV SOLN: 500.0000 mL | Freq: Once | INTRAVENOUS | Status: DC

## 2020-09-01 MED ORDER — EPHEDRINE 5 MG/ML INJ: 10.0000 mg | INTRAVENOUS | Status: DC | PRN

## 2020-09-01 MED ORDER — OXYTOCIN BOLUS FROM INFUSION
333.0000 mL | Freq: Once | INTRAVENOUS | Status: AC
  Administered 2020-09-01: 333 mL via INTRAVENOUS

## 2020-09-01 MED ORDER — DIPHENHYDRAMINE HCL 50 MG/ML IJ SOLN: 12.5000 mg | INTRAMUSCULAR | Status: DC | PRN

## 2020-09-01 MED ORDER — FENTANYL CITRATE (PF) 100 MCG/2ML IJ SOLN
100.0000 ug | INTRAMUSCULAR | Status: DC | PRN
  Administered 2020-09-01: 100 ug via INTRAVENOUS
  Filled 2020-09-01: qty 2

## 2020-09-01 MED ORDER — OXYTOCIN-SODIUM CHLORIDE 30-0.9 UT/500ML-% IV SOLN
1.0000 m[IU]/min | INTRAVENOUS | Status: DC
  Administered 2020-09-01: 2 m[IU]/min via INTRAVENOUS

## 2020-09-01 MED ORDER — LACTATED RINGERS IV SOLN: 500.0000 mL | INTRAVENOUS | Status: DC | PRN

## 2020-09-01 MED ORDER — LACTATED RINGERS IV BOLUS
1000.0000 mL | Freq: Once | INTRAVENOUS | Status: AC
  Administered 2020-09-01: 1000 mL via INTRAVENOUS

## 2020-09-01 NOTE — Progress Notes (Signed)
To BR and then to BS via w/c

## 2020-09-01 NOTE — Progress Notes (Signed)
LABOR PROGRESS NOTE  Donna Simon is a 20 y.o. G1P0 at [redacted]w[redacted]d  admitted for IOL for decelerations while in MAU - decels resolved shortly after admission but patient kept for induction due to new diagnoses of FGR based on AC at 6%tile.   Subjective: Patient doing well, no complaints or concerns at this time. Patient breathing through contractions. Moving around and staying active throughout labor.   Objective: BP 101/65   Pulse 87   Temp 98.9 F (37.2 C) (Oral)   Resp 18   Ht 5\' 5"  (1.651 m)   Wt 91.2 kg   LMP 12/15/2019   SpO2 99%   BMI 33.46 kg/m  or  Vitals:   09/01/20 0406 09/01/20 0731 09/01/20 1005 09/01/20 1047  BP: 113/76 131/81 101/65   Pulse: 79 76 87   Resp: 18     Temp: 98.8 F (37.1 C) 98 F (36.7 C)  98.9 F (37.2 C)  TempSrc: Oral Oral  Oral  SpO2:      Weight: 91.2 kg     Height: 5\' 5"  (1.651 m)       S/p FB - Educated and discussed with patient AROM to help progress to active labor so that patient can get in waterbirth tub.  Dilation: 5 Effacement (%): 60 Cervical Position: Anterior Station: -1 Presentation: Vertex Exam by:: , CNM FHT: baseline rate 130, moderate varibility, +accel, irregular variable decel Toco: 2-5, mild by palpation   Labs: Lab Results  Component Value Date   WBC 13.7 (H) 09/01/2020   HGB 11.1 (L) 09/01/2020   HCT 33.5 (L) 09/01/2020   MCV 93.8 09/01/2020   PLT 242 09/01/2020    Patient Active Problem List   Diagnosis Date Noted  . Labor and delivery, indication for care 09/01/2020  . Fall at home, initial encounter 08/23/2020  . Decreased fetal movement, third trimester, fetus 1 08/23/2020  . Echogenic intracardiac focus of fetus on prenatal ultrasound 05/08/2020  . Encounter for supervision of normal pregnancy, antepartum 04/04/2020  . Back pain in pregnancy 04/04/2020    Assessment / Plan: 20 y.o. G1P0 at [redacted]w[redacted]d here for IOL   Labor: AROM performed @ 1047, clear fluid. Plans to recheck patient  around 1300 to determine active labor status  Fetal Wellbeing:  Cat II  Pain Control:  Natural  Anticipated MOD:  SVD  36, CNM 09/01/2020, 11:02 AM

## 2020-09-01 NOTE — Progress Notes (Signed)
Dr Myriam Jacobson notified of pt's admission and status. Aware of ctx pattern, sve, hx growth restriction, FM strip with minimal variabilty, one late decel. With 10x10 accels. MD will review FM strip. Will watch patient for now

## 2020-09-01 NOTE — Progress Notes (Signed)
Vitals:   09/01/20 1731 09/01/20 1800 09/01/20 1830 09/01/20 1900  BP: (!) 103/56 114/63 119/67 133/61  Pulse: 76 (!) 103 100 98  Resp:   17   Temp: 98.4 F (36.9 C)     TempSrc: Oral     SpO2:    100%  Weight:      Height:        Patient breathing through contractions, rates pain 8/10 - requesting IV pain medication.   Dilation: 6 Effacement (%): 70 Cervical Position: Anterior Station: -1 Presentation: Vertex Exam by:: Mellody Dance, RN  IV Fentanyl ordered for pain management, patient may have epidural if she wants  Currently on 86milli-unit/min of pitocin   FHR 140/moderate/+accelerations/ variable decel  Toco - 2-3 minutes Plans SVD    Sharyon Cable, CNM 09/01/20, 7:18 PM

## 2020-09-01 NOTE — H&P (Signed)
OBSTETRIC ADMISSION HISTORY AND PHYSICAL  Rikia Sukhu is a 20 y.o. female G1P0 with IUP at 66w2dby LMP presenting for contractions, found to have recurrent variables and brought upstairs for IOL. She reports +FMs, No LOF, no VB, no blurry vision, headaches or peripheral edema, and RUQ pain.  She plans on breast feeding. She request nexplanon for birth control. [facility] c/w 7wk [place]--->  Estimated Date of Delivery: 09/20/20  Sono:    _0 , CWD, normal anatomy, cephalic presentation,  22924M 15% EFW, but with AC at 6th%ile    Prenatal History/Complications:  -newly diagnosed FGR based on AC at 6%ile, normal dopplers today in office  -isolated echogenic intracardiac focus of the heart  -BMI 34  Past Medical History: History reviewed. No pertinent past medical history.  Past Surgical History: Past Surgical History:  Procedure Laterality Date  . APPENDECTOMY Right 2009    Obstetrical History: OB History    Gravida  1   Para      Term      Preterm      AB      Living        SAB      IAB      Ectopic      Multiple      Live Births              Social History Social History   Socioeconomic History  . Marital status: Single    Spouse name: Not on file  . Number of children: Not on file  . Years of education: Not on file  . Highest education level: Not on file  Occupational History  . Not on file  Tobacco Use  . Smoking status: Never Smoker  . Smokeless tobacco: Never Used  Vaping Use  . Vaping Use: Never used  Substance and Sexual Activity  . Alcohol use: Not Currently  . Drug use: Never  . Sexual activity: Not on file  Other Topics Concern  . Not on file  Social History Narrative  . Not on file   Social Determinants of Health   Financial Resource Strain: Not on file  Food Insecurity: Not on file  Transportation Needs: Not on file  Physical Activity: Not on file  Stress: Not on file   Social Connections: Not on file    Family History: Family History  Problem Relation Age of Onset  . Hypertension Mother   . Obesity Mother   . Miscarriages / [place]   . Obesity Father     Allergies: No Known Allergies  Medications Prior to Admission  Medication Sig Dispense Refill Last Dose  . Evening Primrose Oil 500 MG CAPS Place vaginally.   08/31/2020 at Unknown time  . EVENING PRIMROSE OIL PO Take by mouth.   08/31/2020 at Unknown time  . famotidine (PEPCID) 20 MG tablet Take 1 tablet (20 mg total) by mouth 2 (two) times daily. 60 tablet 2 08/31/2020 at Unknown time  . Prenatal Vit-Fe Fumarate-FA (PRENATAL MULTIVITAMIN) TABS tablet Take 1 tablet by mouth daily at 12 noon.   08/31/2020 at Unknown time  . Blood Pressure Monitoring (BLOOD PRESSURE KIT) DEVI 1 Device by Does not apply route as needed. 1 each 0   . Misc. Devices MISC Dispense one maternity belt for patient 1 Device 0      Review of Systems   All systems reviewed and negative except as stated in HPI  Blood pressure 113/76, pulse 79, temperature 98.8 F (37.1 C), temperature source Oral, resp. rate 18, height _0  (1.651 m), weight 91.2 kg, last menstrual period 12/15/2019, SpO2 99 %. General appearance: alert, cooperative and appears stated age Lungs: clear to auscultation bilaterally Abdomen: soft, non-tender; bowel sounds normal Pelvic: 2/90/-1 Extremities: Homans sign is negative, no sign of DVT   Presentation: cephalic Fetal monitoring baseline 125/mod variability/pos accels/ few late and variable decels in MAU, now resolved  Uterine activity: mild, q3-5 min  Dilation: 2 Effacement (%): 90 Station: -1 Exam by:: Dr. Berniece Andreas   Prenatal labs: ABO, Rh: --/--/A POS (01/28 0245) Antibody: NEG (01/28 0245) Rubella: 3.44 (08/31 1509) RPR: Non Reactive (11/22 1018)  HBsAg: Negative (08/31 1509)  HIV: Non Reactive (11/22 1018)  GBS: Negative/-- (01/19 1020)  2 hr Glucola normal Genetic  screening  normal Anatomy US EIF noted, otherwise normal. Female   Prenatal Transfer Tool  Maternal Diabetes: No Genetic Screening: Normal Maternal Ultrasounds/Referrals: Isolated EIF (echogenic intracardiac focus) Fetal Ultrasounds or other Referrals:  Referred to Materal Fetal Medicine  Maternal Substance Abuse:  No Significant Maternal Medications:  None Significant Maternal Lab Results: Group B Strep negative  Results for orders placed or performed during the hospital encounter of 09/01/20 (from the past 24 hour(s))  CBC   Collection Time: 09/01/20  2:45 AM  Result Value Ref Range   WBC 13.7 (H) 4.0 - 10.5 K/uL   RBC 3.57 (L) 3.87 - 5.11 MIL/uL   Hemoglobin 11.1 (L) 12.0 - 15.0 g/dL   HCT 33.5 (L) 36.0 - 46.0 %   MCV 93.8 80.0 - 100.0 fL   MCH 31.1 26.0 - 34.0 pg   MCHC 33.1 30.0 - 36.0 g/dL   RDW 14.3 11.5 - 15.5 %   Platelets 242 150 - 400 K/uL   nRBC 0.0 0.0 - 0.2 %  Type and screen [place]   Collection Time: 09/01/20  2:45 AM  Result Value Ref Range   ABO/RH(D) A POS    Antibody Screen NEG    Sample Expiration      09/04/2020,2359 Performed at [facility], [place] [address], [place], [place] [address]   SARS Coronavirus 2 by RT PCR (hospital order, performed in [facility] hospital lab) Nasopharyngeal Nasopharyngeal Swab   Collection Time: 09/01/20  3:05 AM   Specimen: Nasopharyngeal Swab  Result Value Ref Range   SARS Coronavirus 2 NEGATIVE NEGATIVE    Patient Active Problem List   Diagnosis Date Noted  . Labor and delivery, indication for care 09/01/2020  . Fall at home, initial encounter 08/23/2020  . Decreased fetal movement, third trimester, fetus 1 08/23/2020  . Echogenic intracardiac focus of fetus on prenatal ultrasound 05/08/2020  . Encounter for supervision of normal pregnancy, antepartum 04/04/2020  . Back pain in pregnancy 04/04/2020    Assessment/Plan:  Warda Mcqueary is a 20 y.o. G1P0 at 76w2dhere for IOL for decels  in MAU   #Labor: 1/70/-1 in MAU, now 2/90/-1. Patient currently contracting 2-5 min, possibly in early labor. FB placed with ease, will monitor for now. Patient strongly desires waterbirth if able. Discussed limitations in setting of induction and fetal tracing, will discuss with daytime CNM.   #Pain: Pain meds, epidural prn  #FWB: Cat I at this time  #ID:  gbs neg  #MOF: breast #MOC:nexplanon, undecided on inpt vs outpt.  #Circ:  Yes   JJanet Berlin MD  09/01/2020, 5:33 AM

## 2020-09-01 NOTE — Discharge Summary (Signed)
a    Postpartum Discharge Summary       Patient Name: Donna Simon DOB: 01/18/2001 MRN: [id number]  Date of admission: 09/01/2020 Delivery date:09/01/2020  Delivering provider: MARSALA, JULIA M  Date of discharge: 09/03/2020  Admitting diagnosis: Labor and delivery, indication for care [O75.9] Intrauterine pregnancy: [redacted]w[redacted]d     Secondary diagnosis:  Active Problems:   Labor and delivery, indication for care   Vaginal delivery  Additional problems: none    Discharge diagnosis: Term Pregnancy Delivered                                              Post partum procedures:nexplanon Augmentation: FB, AROM and Pitocin Complications: None  Hospital course: Onset of Labor With Vaginal Delivery      19 y.o. yo G1P0 at [redacted]w[redacted]d was admitted in Latent Labor on 09/01/2020. Patient had an uncomplicated labor course as follows:  Membrane Rupture Time/Date: 10:47 AM ,09/01/2020   Delivery Method:Vaginal, Spontaneous  Episiotomy: None  Lacerations:  None  Patient had an uncomplicated postpartum course.  She is ambulating, tolerating a regular diet, passing flatus, and urinating well. Patient is discharged home in stable condition on 09/03/20.  Newborn Data: Birth date:09/01/2020  Birth time:10:33 PM  Gender:Female  Living status:Living  Apgars:8 ,9  Weight:2750 g   Magnesium Sulfate received: No BMZ received: No Rhophylac:N/A MMR:N/A T-DaP:Given prenatally Flu: Yes Transfusion:No  Physical exam  Vitals:   09/02/20 1400 09/02/20 2056 09/03/20 0500 09/03/20 0514  BP: (!) 95/51 (!) 103/58 (!) 88/69 112/71  Pulse: 69 60 61   Resp: 18 16 18   Temp: 98.4 F (36.9 C) 97.8 F (36.6 C) 98.1 F (36.7 C)   TempSrc: Oral Oral Oral   SpO2:  100% 97%   Weight:      Height:       General: alert, cooperative and no distress Lochia: appropriate Uterine Fundus: firm Incision: N/A DVT Evaluation: No evidence of DVT seen on physical exam. Labs: Lab Results  Component Value Date   WBC 13.7  (H) 09/01/2020   HGB 11.1 (L) 09/01/2020   HCT 33.5 (L) 09/01/2020   MCV 93.8 09/01/2020   PLT 242 09/01/2020   No flowsheet data found. Edinburgh Score: Edinburgh Postnatal Depression Scale Screening Tool 09/02/2020  I have been able to laugh and see the funny side of things. 0  I have looked forward with enjoyment to things. 0  I have blamed myself unnecessarily when things went wrong. 0  I have been anxious or worried for no good reason. 0  I have felt scared or panicky for no good reason. 0  Things have been getting on top of me. 0  I have been so unhappy that I have had difficulty sleeping. 0  I have felt sad or miserable. 0  I have been so unhappy that I have been crying. 0  The thought of harming myself has occurred to me. 0  Edinburgh Postnatal Depression Scale Total 0     After visit meds:  Allergies as of 09/03/2020   No Known Allergies     Medication List    STOP taking these medications   Blood Pressure Kit Devi   Misc. Devices Misc     TAKE these medications   acetaminophen 325 MG tablet Commonly known as: Tylenol Take 2 tablets (650 mg total) by mouth every 4 (four)   hours as needed (for pain scale < 4).   EVENING PRIMROSE OIL PO Take by mouth.   Evening Primrose Oil 500 MG Caps Place vaginally.   famotidine 20 MG tablet Commonly known as: Pepcid Take 1 tablet (20 mg total) by mouth 2 (two) times daily.   ibuprofen 600 MG tablet Commonly known as: ADVIL Take 1 tablet (600 mg total) by mouth every 6 (six) hours.   prenatal multivitamin Tabs tablet Take 1 tablet by mouth daily at 12 noon.        Discharge home in stable condition Infant Feeding: Breast Infant Disposition:home with mother Discharge instruction: per After Visit Summary and Postpartum booklet. Activity: Advance as tolerated. Pelvic rest for 6 weeks.  Diet: routine diet Future Appointments: Future Appointments  Date Time Provider Department Center  09/05/2020  8:35 AM  Weinhold, Samantha C, CNM [facility] None   Follow up Visit:   Please schedule this patient for a virtual postpartum visit in 6 weeks with the following provider: Any provider. Additional Postpartum F/U:none  Low risk pregnancy complicated by: FGR Delivery mode:  Vaginal, Spontaneous  Anticipated Birth Control:  PP Nexplanon placed     09/03/2020 Julia M Marsala, MD   

## 2020-09-01 NOTE — Progress Notes (Signed)
Vitals:   09/01/20 1047 09/01/20 1151 09/01/20 1317 09/01/20 1530  BP:  119/65 114/74 118/70  Pulse:  89 (!) 104 92  Resp:  17 16 17   Temp: 98.9 F (37.2 C)  98.4 F (36.9 C)   TempSrc: Oral  Oral   SpO2:      Weight:      Height:       Patient doing well, reports occasional strong contractions but denies contractions being regular.   No cervical change Dilation: 5 Effacement (%): 70 Cervical Position: Anterior Station: -1 Presentation: Vertex Exam by:: [email or web address] and discussed with patient recommendations of pitocin at this time. Discussed with patient this disqualifies her for a waterbirth. Patient verbalizes understanding and agrees with plan of care.   Pitocin ordered and started @1621 .  FHR 130/moderate/+accels/ no decelerations  Toco 4-5  Cat I tracing   Rennie Natter, CNM 09/01/20, 4:26 PM

## 2020-09-01 NOTE — MAU Note (Signed)
Ctxs for couple of days. About 2 hours ago they ctxs closer and stronger. Now they seem like they are fading away somewhat

## 2020-09-01 NOTE — Anesthesia Procedure Notes (Signed)
Epidural Patient location during procedure: OB Start time: 09/01/2020 7:59 PM End time: 09/01/2020 8:07 PM  Staffing Anesthesiologist: Mal Amabile, MD Performed: anesthesiologist   Preanesthetic Checklist Completed: patient identified, IV checked, site marked, risks and benefits discussed, surgical consent, monitors and equipment checked, pre-op evaluation and timeout performed  Epidural Patient position: sitting Prep: DuraPrep and site prepped and draped Patient monitoring: continuous pulse ox and blood pressure Approach: midline Location: L3-L4 Injection technique: LOR air  Needle:  Needle type: Tuohy  Needle gauge: 17 G Needle length: 9 cm and 9 Needle insertion depth: 6 cm Catheter type: closed end flexible Catheter size: 19 Gauge Catheter at skin depth: 11 cm Test dose: negative and Other  Assessment Events: blood not aspirated, injection not painful, no injection resistance, no paresthesia and negative IV test  Additional Notes Patient identified. Risks and benefits discussed including failed block, incomplete  Pain control, post dural puncture headache, nerve damage, paralysis, blood pressure Changes, nausea, vomiting, reactions to medications-both toxic and allergic and post Partum back pain. All questions were answered. Patient expressed understanding and wished to proceed. Sterile technique was used throughout procedure. Epidural site was Dressed with sterile barrier dressing. No paresthesias, signs of intravascular injection Or signs of intrathecal spread were encountered.  Patient was more comfortable after the epidural was dosed. Please see RN's note for documentation of vital signs and FHR which are stable. Reason for block:procedure for pain

## 2020-09-01 NOTE — Progress Notes (Signed)
Labor Progress Note Donna Simon is a 20 y.o. G1P0 at [redacted]w[redacted]d presented for early labor.   S: Feeling relief w epidural, very tired   O:  BP 114/62   Pulse 86   Temp 98.7 F (37.1 C)   Resp 17   Ht 5\' 5"  (1.651 m)   Wt 91.2 kg   LMP 12/15/2019   SpO2 99%   BMI 33.46 kg/m  EFM: baseline 145/mod variability/pos accels/variable decels   CVE: Dilation: 7 Effacement (%): 90 Cervical Position: Anterior Station: -1 Presentation: Vertex Exam by:: Verkler RN   Assessment/Plan:  Donna Simon is a 20 y.o. G1P0 at [redacted]w[redacted]d here for IOL for decels in MAU   #Labor: S/p FB. AROM 1047. Made slow change over course of day and decision made to start pitocin at 1621. Baby likely OP, continue repositioning maneuvers. Will recheck at 4 hour mark.   #Pain:  Epidural in place  #FWB: Cat I   #ID:      gbs neg  #MOF: breast #MOC:nexplanon  #Circ:   Yes     [redacted]w[redacted]d, MD 10:02 PM

## 2020-09-01 NOTE — Progress Notes (Signed)
Vitals:   09/01/20 1005 09/01/20 1047 09/01/20 1151 09/01/20 1317  BP: 101/65  119/65 114/74  Pulse: 87  89 (!) 104  Resp:   17 16  Temp:  98.9 F (37.2 C)    TempSrc:  Oral    SpO2:      Weight:      Height:       Patient doing well, denies worsening contractions, patient reports mild cramping.   No cervical change with recent examination  Fetus OP based on bedside US  Dilation: 5 Effacement (%): 70 Cervical Position: Anterior Station: -1 Presentation: Vertex Exam by:: Lanice Shirts  Patient repositioned in flying cowgirl to help with readjustment of fetal position - patient to stay in position for 35-45 minutes then switch to position in other side.   Educated and discussed with patient that will reassess for cervical change around 1600- if no cervical change then would recommend initiation of pitocin at that point.   FHR 135/moderate/+accels/ variable decel  Toco - 7 minutes  Cat I tracing   Plans SVD.   Sharyon Cable, CNM 09/01/20, 1:35 PM

## 2020-09-01 NOTE — Progress Notes (Signed)
Pt had turned self to L lateral but back to semifowlers for Iv start

## 2020-09-01 NOTE — Progress Notes (Signed)
Dr Leary Roca notified of prolonged variable and repeat SVE with no change. Will discuss with Dr Myriam Jacobson and review FM strip

## 2020-09-01 NOTE — Anesthesia Preprocedure Evaluation (Signed)
Anesthesia Evaluation  Patient identified by MRN, date of birth, ID band Patient awake    Reviewed: Allergy & Precautions, Patient's Chart, lab work & pertinent test results  Airway Mallampati: II  TM Distance: >3 FB Neck ROM: Full    Dental no notable dental hx. (+) Teeth Intact   Pulmonary neg pulmonary ROS,    Pulmonary exam normal breath sounds clear to auscultation       Cardiovascular negative cardio ROS Normal cardiovascular exam Rhythm:Regular Rate:Normal     Neuro/Psych negative neurological ROS  negative psych ROS   GI/Hepatic Neg liver ROS, GERD  Medicated and Controlled,  Endo/Other  Obesity  Renal/GU negative Renal ROS  negative genitourinary   Musculoskeletal   Abdominal (+) + obese,   Peds  Hematology  (+) anemia ,   Anesthesia Other Findings   Reproductive/Obstetrics (+) Pregnancy IUGR                             Anesthesia Physical Anesthesia Plan  ASA: II  Anesthesia Plan: Epidural   Post-op Pain Management:    Induction:   PONV Risk Score and Plan:   Airway Management Planned: Natural Airway  Additional Equipment:   Intra-op Plan:   Post-operative Plan:   Informed Consent: I have reviewed the patients History and Physical, chart, labs and discussed the procedure including the risks, benefits and alternatives for the proposed anesthesia with the patient or authorized representative who has indicated his/her understanding and acceptance.       Plan Discussed with: Anesthesiologist  Anesthesia Plan Comments:         Anesthesia Quick Evaluation

## 2020-09-02 DIAGNOSIS — Z30017 Encounter for initial prescription of implantable subdermal contraceptive: Secondary | ICD-10-CM

## 2020-09-02 MED ORDER — COCONUT OIL OIL
1.0000 "application " | TOPICAL_OIL | Status: DC | PRN
  Administered 2020-09-03: 1 via TOPICAL

## 2020-09-02 MED ORDER — SIMETHICONE 80 MG PO CHEW: 80.0000 mg | CHEWABLE_TABLET | ORAL | Status: DC | PRN

## 2020-09-02 MED ORDER — IBUPROFEN 600 MG PO TABS
600.0000 mg | ORAL_TABLET | Freq: Four times a day (QID) | ORAL | Status: DC
  Administered 2020-09-02 – 2020-09-03 (×6): 600 mg via ORAL
  Filled 2020-09-02 (×6): qty 1

## 2020-09-02 MED ORDER — TETANUS-DIPHTH-ACELL PERTUSSIS 5-2.5-18.5 LF-MCG/0.5 IM SUSY: 0.5000 mL | PREFILLED_SYRINGE | Freq: Once | INTRAMUSCULAR | Status: DC

## 2020-09-02 MED ORDER — ETONOGESTREL 68 MG ~~LOC~~ IMPL
68.0000 mg | DRUG_IMPLANT | Freq: Once | SUBCUTANEOUS | Status: AC
  Administered 2020-09-02: 68 mg via SUBCUTANEOUS
  Filled 2020-09-02: qty 1

## 2020-09-02 MED ORDER — ZOLPIDEM TARTRATE 5 MG PO TABS: 5.0000 mg | ORAL_TABLET | Freq: Every evening | ORAL | Status: DC | PRN

## 2020-09-02 MED ORDER — ACETAMINOPHEN 325 MG PO TABS: 650.0000 mg | ORAL_TABLET | ORAL | Status: DC | PRN

## 2020-09-02 MED ORDER — BENZOCAINE-MENTHOL 20-0.5 % EX AERO
1.0000 | INHALATION_SPRAY | CUTANEOUS | Status: DC | PRN
Start: 2020-09-02 — End: 2020-09-03

## 2020-09-02 MED ORDER — DIBUCAINE (PERIANAL) 1 % EX OINT: 1.0000 "application " | TOPICAL_OINTMENT | CUTANEOUS | Status: DC | PRN

## 2020-09-02 MED ORDER — PRENATAL MULTIVITAMIN CH
1.0000 | ORAL_TABLET | Freq: Every day | ORAL | Status: DC
  Administered 2020-09-02 – 2020-09-03 (×2): 1 via ORAL
  Filled 2020-09-02 (×2): qty 1

## 2020-09-02 MED ORDER — ONDANSETRON HCL 4 MG/2ML IJ SOLN: 4.0000 mg | INTRAMUSCULAR | Status: DC | PRN

## 2020-09-02 MED ORDER — WITCH HAZEL-GLYCERIN EX PADS: 1.0000 "application " | MEDICATED_PAD | CUTANEOUS | Status: DC | PRN

## 2020-09-02 MED ORDER — LIDOCAINE HCL 1 % IJ SOLN
0.0000 mL | Freq: Once | INTRAMUSCULAR | Status: AC | PRN
  Administered 2020-09-02: 20 mL via INTRADERMAL
  Filled 2020-09-02: qty 20

## 2020-09-02 MED ORDER — ONDANSETRON HCL 4 MG PO TABS: 4.0000 mg | ORAL_TABLET | ORAL | Status: DC | PRN

## 2020-09-02 MED ORDER — DIPHENHYDRAMINE HCL 25 MG PO CAPS
25.0000 mg | ORAL_CAPSULE | Freq: Four times a day (QID) | ORAL | Status: DC | PRN
Start: 2020-09-02 — End: 2020-09-03

## 2020-09-02 MED ORDER — SENNOSIDES-DOCUSATE SODIUM 8.6-50 MG PO TABS
2.0000 | ORAL_TABLET | ORAL | Status: DC
  Administered 2020-09-02: 2 via ORAL
  Filled 2020-09-02: qty 2

## 2020-09-02 NOTE — Lactation Note (Signed)
This note was copied from a baby's chart. Lactation Consultation Note  Patient Name: Donna Simon [id number] Date: 09/02/2020 Reason for consult: Follow-up assessment;Early term 37-38.6wks;Primapara;1st time breastfeeding Age:22 hours  P1 mother whose infant is now 38 hours old.  This is an ETI at 37+2 weeks weighing 6 lbs 1 oz.  Mother had just finished breast feeding prior to my arrival.  She stated her son has a strong suck and breast fed well for approximately 18 minutes.  She denied pain with latching.  Discussed the "ETI" with parents and suggested mother begin pumping with the DEBP.  Explained how this would be beneficial for mother and baby.  Mother willing to do this.  Pump parts, assembly, disassembly and cleaning reviewed.  Wash station set up.  Observed mother pumping for approximately 10 minutes and changed the #24 flange size to a #27 flange size for greater fit and comfort.  Mother knows to save all EBM and feed back to baby any drops she obtains.  Encouraged her to call her RN/LC for latch assistance as needed.  Mom made aware of O/P services, breastfeeding support groups, community resources, and our phone # for post-discharge questions.  Mother has a DEBP for home use.  Father present and supportive.  RN updated.    Maternal Data    Feeding Feeding Type: Breast Fed  LATCH Score                   Interventions    Lactation Tools Discussed/Used Pump Education: Setup, frequency, and cleaning;Milk Storage Initiated by:: Donna Simon Date initiated:: 09/02/20   Consult Status Consult Status: Follow-up Date: 09/03/20 Follow-up type: In-patient    Saphronia Ozdemir R Cystal Shannahan 09/02/2020, 1:55 PM

## 2020-09-02 NOTE — Lactation Note (Signed)
This note was copied from a baby's chart. Lactation Consultation Note  Patient Name: Donna Simon Date: 09/02/2020 Reason for consult: Follow-up assessment Age:22 hours   LC Follow Up Visit:  Second attempt to visit with mother, however, she is now in the shower.  Asked father to have the RN call me when mother is available.  Father verbalized understanding.   Maternal Data    Feeding Feeding Type: Breast Fed  LATCH Score Latch: Grasps breast easily, tongue down, lips flanged, rhythmical sucking.  Audible Swallowing: A few with stimulation  Type of Nipple: Everted at rest and after stimulation  Comfort (Breast/Nipple): Soft / non-tender  Hold (Positioning): Full assist, staff holds infant at breast  LATCH Score: 7  Interventions    Lactation Tools Discussed/Used     Consult Status Consult Status: Follow-up Date: 09/02/20 Follow-up type: In-patient    Almer Littleton R Lashia Niese 09/02/2020, 11:58 AM

## 2020-09-02 NOTE — Anesthesia Postprocedure Evaluation (Signed)
Anesthesia Post Note  Patient: Investment banker, operational  Procedure(s) Performed: AN AD HOC LABOR EPIDURAL     Patient location during evaluation: Mother Baby Anesthesia Type: Epidural Level of consciousness: awake and alert Pain management: pain level controlled Vital Signs Assessment: post-procedure vital signs reviewed and stable Respiratory status: spontaneous breathing, nonlabored ventilation and respiratory function stable Cardiovascular status: stable Postop Assessment: no headache, no backache and epidural receding Anesthetic complications: no   No complications documented.  Last Vitals:  Vitals:   09/02/20 0132 09/02/20 0535  BP: 120/70 100/65  Pulse: 87 93  Resp: 20 20  Temp: 37.2 C 36.8 C  SpO2: 100% 100%    Last Pain:  Vitals:   09/02/20 0535  TempSrc: Oral  PainSc:    Pain Goal: Patients Stated Pain Goal: 0 (09/01/20 0125)                 [facility] Records

## 2020-09-02 NOTE — Lactation Note (Signed)
This note was copied from a baby's chart. Lactation Consultation Note  Patient Name: Donna Simon [id number] Date: 09/02/2020 Reason for consult: Follow-up assessment Age:22 hours   LC Follow Up Visit:  Attempted to visit with mother, however, she was in the shower.  Will return later today for a follow up visit.   Maternal Data    Feeding Feeding Type: Breast Fed  LATCH Score Latch: Grasps breast easily, tongue down, lips flanged, rhythmical sucking.  Audible Swallowing: A few with stimulation  Type of Nipple: Everted at rest and after stimulation  Comfort (Breast/Nipple): Soft / non-tender  Hold (Positioning): Full assist, staff holds infant at breast  LATCH Score: 7  Interventions    Lactation Tools Discussed/Used     Consult Status Consult Status: Follow-up Date: 09/02/20 Follow-up type: In-patient    Donna Simon R Azaliyah Kennard 09/02/2020, 11:15 AM

## 2020-09-02 NOTE — Procedures (Signed)
Post-Placental Nexplanon Insertion Procedure Note  Patient was identified. Informed consent was signed, signed copy in chart. A time-out was performed.    The insertion site was identified 8-10 cm (3-4 inches) from the medial epicondyle of the humerus and 3-5 cm (1.25-2 inches) posterior to (below) the sulcus (groove) between the biceps and triceps muscles of the patient's left arm and marked. The site was prepped and draped in the usual sterile fashion. Pt was prepped with alcohol swab and then injected with 3  cc of 1 % lidocaine. The site was prepped with betadine. Nexplanon removed form packaging,  Device confirmed in needle, then inserted full length of needle and withdrawn per handbook instructions. Provider and patient verified presence of the implant in the woman's arm by palpation. Pt insertion site was covered with steristrips/adhesive bandage and pressure bandage. There was minimal blood loss. Patient tolerated procedure well.  Patient was given post procedure instructions and Nexplanon user card with expiration date. Condoms were recommended for STI prevention. Patient was asked to keep the pressure dressing on for 24 hours to minimize bruising and keep the adhesive bandage on for 3-5 days. The patient verbalized understanding of the plan of care and agrees.   Lot # see MAR Expiration Date see [facility]

## 2020-09-02 NOTE — Lactation Note (Signed)
This note was copied from a baby's chart. Lactation Consultation Note  Patient Name: Donna Simon [id number] Date: 09/02/2020 Reason for consult: L&D Initial assessment;1st time breastfeeding;Early term 37-38.6wks Age:22 hours, ETI female infant. (No charge in L&D) [place] congratulated parents on the birth of their son. LC entered room, infant had previously BF for 20 minutes, mom was doing STS with infant. LC discussed hand expression and mom taught back infant was given 4 mls of colostrum by spoon and started cuing again. Mom latched infant on her right breast using the football hold position, infant latched with depth, was still BF after 6 minutes when LC left the room. Mom brought her own DEBP kit form home and use hand pump to pre-pump breast prior to latching infant due to having flat nipples.  Mom understands to BF infant according to primal cues: licking , kissing, smacking, hands in mouth and rooting.  Mom understands  To call MBU ( RN ) or LC if she needs assistance with latching infant at the breast.  LC discussed importance of STS with parents and baby.  LC discussed infant's input and output with parents.  Maternal Data Formula Feeding for Exclusion: No Has patient been taught Hand Expression?: Yes Does the patient have breastfeeding experience prior to this delivery?: No  Feeding Feeding Type: Breast Fed  LATCH Score Latch: Grasps breast easily, tongue down, lips flanged, rhythmical sucking.  Audible Swallowing: Spontaneous and intermittent  Type of Nipple: Flat  Comfort (Breast/Nipple): Soft / non-tender  Hold (Positioning): Assistance needed to correctly position infant at breast and maintain latch.  LATCH Score: 8  Interventions Interventions: Breast feeding basics reviewed;Assisted with latch;Skin to skin;Adjust position;Breast compression;Support pillows;Position options;Hand express;Breast massage;Pre-pump if needed  Lactation Tools Discussed/Used WIC  Program: No   Consult Status Consult Status: Follow-up Date: 09/02/20 Follow-up type: In-patient    Vicente Serene 09/02/2020, 12:01 AM

## 2020-09-03 MED ORDER — ACETAMINOPHEN 325 MG PO TABS: 650.0000 mg | ORAL_TABLET | ORAL | Status: DC | PRN

## 2020-09-03 MED ORDER — IBUPROFEN 600 MG PO TABS: 600.0000 mg | ORAL_TABLET | Freq: Four times a day (QID) | ORAL | 0 refills | Status: DC

## 2020-09-03 NOTE — Lactation Note (Addendum)
This note was copied from a baby's chart. Lactation Consultation Note  Patient Name: Donna Simon NIDPO'E Date: 09/03/2020 Reason for consult: Follow-up assessment;Mother's request;Primapara;1st time breastfeeding;Early term 37-38.6wks;Infant weight loss Age:22 hours   On arrival infant away getting a circumcision. Mom stated infant last fed at 9 am with 8 ml of EBM via spoon feeding and 12 pm for 5 minutes at the breasts.   Mom had some nipple piercing and she notices EBP coming through the holes during latching and pumping. She states the increase in flow is tolerated by the infant at the breasts.   Mom has a Elvie hands free pump. LC verified the flange size of 27 is still a good fit for her. Mom also has coconut oil using for nipple care. Mom states infant can pinch with the latch. LC reviewed with Mom breast compression and positioning ensuring the lips are flanged will help improve the latch.   Mom to call when infant returns to assess the latch.  Mom believes they were missing some urine in the stool. According to the parents, infant had 2 urine and 3 stool since 6 am today.   Plan 1. To feed based on cues 8-12 in 24 hour period no more than 3 hours without an attempt. Mom to offer STS and look for signs of milk transfer. Keep total feeding between breasts and supplementation of EBM under 30 minutes.            2. Dad to offer EBM 10 ml or more via paced bottle feeding with slow flow nipple. Mom to supplement at the end of nursing.              3. Mom to pump using DEBP or at home with her Elvie q 3 hours for 15 minutes.               4. Signs, symptoms, treatment and prevention of engorgement reviewed.               5. All questions answered at the end of the visit.   Infant arrived and was able to latch with help of NS size 16 for 10 minutes. Infant shallow on the nipple causing pinching. Mom has a larger NS at home that she will use. Dad paced bottle fed infant 10 ml with slow  flow nipple.

## 2020-09-04 ENCOUNTER — Ambulatory Visit: Payer: 59

## 2020-09-05 ENCOUNTER — Ambulatory Visit: Payer: 59

## 2020-09-05 ENCOUNTER — Encounter: Payer: 59 | Admitting: Advanced Practice Midwife

## 2020-09-07 ENCOUNTER — Other Ambulatory Visit: Payer: Self-pay

## 2020-09-07 ENCOUNTER — Telehealth: Payer: Self-pay

## 2020-09-07 ENCOUNTER — Encounter: Payer: Self-pay | Admitting: Obstetrics

## 2020-09-07 ENCOUNTER — Inpatient Hospital Stay (HOSPITAL_COMMUNITY): Admission: AD | Admit: 2020-09-07 | Payer: 59 | Source: Home / Self Care | Admitting: Family Medicine

## 2020-09-07 ENCOUNTER — Inpatient Hospital Stay (HOSPITAL_COMMUNITY): Payer: 59

## 2020-09-07 ENCOUNTER — Ambulatory Visit (INDEPENDENT_AMBULATORY_CARE_PROVIDER_SITE_OTHER): Payer: 59 | Admitting: Obstetrics

## 2020-09-07 VITALS — BP 128/69 | HR 70 | Ht 65.0 in | Wt 190.6 lb

## 2020-09-07 DIAGNOSIS — K640 First degree hemorrhoids: Secondary | ICD-10-CM

## 2020-09-07 DIAGNOSIS — L02422 Furuncle of left axilla: Secondary | ICD-10-CM

## 2020-09-07 MED ORDER — AMOXICILLIN-POT CLAVULANATE 875-125 MG PO TABS: 1.0000 | ORAL_TABLET | Freq: Two times a day (BID) | ORAL | 0 refills | Status: DC

## 2020-09-07 NOTE — Progress Notes (Signed)
Patient ID: Donna Simon, female   DOB: June 11, 2001, 20 y.o.   MRN: [id number]  Chief Complaint  Patient presents with  . Postpartum Follow-up    HPI Donna Simon is a 20 y.o. female.  Complains of  Lump in left axillary area, tender.  Denies erythema, warmth or drainage.  Also complains of small external hemorrhoid, non tender.  S/P NSVD ~ 1 week ago, no complications. HPI  History reviewed. No pertinent past medical history.  Past Surgical History:  Procedure Laterality Date  . APPENDECTOMY Right 2009    Family History  Problem Relation Age of Onset  . Hypertension Mother   . Obesity Mother   . Miscarriages / [place] Mother   . Obesity Father     Social History Social History   Tobacco Use  . Smoking status: Never Smoker  . Smokeless tobacco: Never Used  Vaping Use  . Vaping Use: Never used  Substance Use Topics  . Alcohol use: Not Currently  . Drug use: Never    No Known Allergies  Current Outpatient Medications  Medication Sig Dispense Refill  . acetaminophen (TYLENOL) 325 MG tablet Take 2 tablets (650 mg total) by mouth every 4 (four) hours as needed (for pain scale < 4).    . amoxicillin-clavulanate (AUGMENTIN) 875-125 MG tablet Take 1 tablet by mouth 2 (two) times daily. 14 tablet 0  . Evening Primrose Oil 500 MG CAPS Place vaginally. (Patient not taking: Reported on 09/07/2020)    . EVENING PRIMROSE OIL PO Take by mouth. (Patient not taking: Reported on 09/07/2020)    . famotidine (PEPCID) 20 MG tablet Take 1 tablet (20 mg total) by mouth 2 (two) times daily. (Patient not taking: Reported on 09/07/2020) 60 tablet 2  . ibuprofen (ADVIL) 600 MG tablet Take 1 tablet (600 mg total) by mouth every 6 (six) hours. (Patient not taking: Reported on 09/07/2020) 30 tablet 0  . Prenatal Vit-Fe Fumarate-FA (PRENATAL MULTIVITAMIN) TABS tablet Take 1 tablet by mouth daily at 12 noon. (Patient not taking: Reported on 09/07/2020)     No current facility-administered  medications for this visit.    Review of Systems Review of Systems Constitutional: negative for fatigue and weight loss Respiratory: negative for cough and wheezing Cardiovascular: negative for chest pain, fatigue and palpitations Gastrointestinal: negative for abdominal pain and change in bowel habits.  Positive for small external hemorrhoidal tag  Genitourinary:negative Integument/breast: negative for nipple discharge Musculoskeletal:negative for myalgias Neurological: negative for gait problems and tremors Behavioral/Psych: negative for abusive relationship, depression Endocrine: negative for temperature intolerance      Blood pressure 128/69, pulse 70, height 5\' 5"  (1.651 m), weight 190 lb 9.6 oz (86.5 kg), last menstrual period 12/15/2019, currently breastfeeding.  Physical Exam Physical Exam General:   alert  Skin:   no rash or abnormalities  Lungs:   clear to auscultation bilaterally  Heart:   regular rate and rhythm, S1, S2 normal, no murmur, click, rub or gallop  Breasts:   normal without suspicious masses, skin or nipple changes or axillary nodes  Abdomen:  normal findings: no organomegaly, soft, non-tender and no hernia  Pelvis:  External genitalia: normal general appearance Urinary system: urethral meatus normal and bladder without fullness, nontender Vaginal: normal without tenderness, induration or masses Cervix: normal appearance Adnexa: normal bimanual exam Uterus: anteverted and non-tender, normal size                            Left  axilla: furuncle, soft, mildly tender                          Anus: small external hemorrhoidal tag at 12 o'clock, non tender   50% of 15 min visit spent on counseling and coordination of care.   Data Reviewed Labs  Assessment     1. Furuncle of left axilla Rx: - amoxicillin-clavulanate (AUGMENTIN) 875-125 MG tablet; Take 1 tablet by mouth 2 (two) times daily.  Dispense: 14 tablet; Refill: 0  2. Grade I hemorrhoids -  preparation H, drink plenty of fluids, avoid constipation    Plan   Follow up in 6 weeks   Meds ordered this encounter  Medications  . DISCONTD: amoxicillin-clavulanate (AUGMENTIN) 875-125 MG tablet    Sig: Take 1 tablet by mouth 2 (two) times daily.    Dispense:  20 tablet    Refill:  0  . amoxicillin-clavulanate (AUGMENTIN) 875-125 MG tablet    Sig: Take 1 tablet by mouth 2 (two) times daily.    Dispense:  14 tablet    Refill:  0     Brock Bad, MD 09/07/2020 3:17 PM

## 2020-09-07 NOTE — Telephone Encounter (Signed)
Return call to pt regarding vm  C/o ball/lump under arm  painful to touch onset 2 nights ago  Quarter size  Only under left  arm pit Pt is currently breast feeding and pumping , no fever, no bleeding from breast pt advised to make appt to have provider look at area.  Pt agreeable and voiced understanding.

## 2020-09-07 NOTE — Progress Notes (Signed)
Pt is in the office after vaginal delivery 09/01/20. Pt complains of a tender lump under left axilla for 2 days, currently breast feeding every 2 hours.  Pt denies fever Complains of possible hemorrhoids, denies pain in rectum but states that it feels like something is there.

## 2020-09-27 ENCOUNTER — Ambulatory Visit (INDEPENDENT_AMBULATORY_CARE_PROVIDER_SITE_OTHER): Payer: Medicaid Other | Admitting: Obstetrics

## 2020-09-27 ENCOUNTER — Encounter: Payer: Self-pay | Admitting: Obstetrics

## 2020-09-27 ENCOUNTER — Other Ambulatory Visit: Payer: Self-pay

## 2020-09-27 VITALS — BP 122/64 | HR 62 | Wt 183.0 lb

## 2020-09-27 DIAGNOSIS — Z3046 Encounter for surveillance of implantable subdermal contraceptive: Secondary | ICD-10-CM

## 2020-09-27 DIAGNOSIS — Z3009 Encounter for other general counseling and advice on contraception: Secondary | ICD-10-CM | POA: Diagnosis not present

## 2020-09-27 NOTE — Progress Notes (Signed)
GYN presents for Nexplanon removal, due to side effects of HA 5/10, NV.

## 2020-09-27 NOTE — Progress Notes (Signed)
NEXPLANON REMOVAL NOTE  Date of LMP:   unknown  Contraception used: *Nexplanon   Indications:  The patient desires removal of Nexplanon.due to headaches, nausea / vomiting.  She understands risks, benefits, and alternatives to Implanon and would like to proceed.  Anesthesia:   Lidocaine 1% plain.  Procedure:  A time-out was performed confirming the procedure and the patient's allergy status.  Complications: None                      The rod was palpated and the area was sterilely prepped.  The area beneath the distal tip was anesthetized with 1% xylocaine and the skin incised                       Over the tip and the tip was exposed, grasped with forcep and removed intact.  A single suture of 4-0 Vicryl was used to close incision.  Steri strip                       And a bandage applied and the arm was wrapped with gauze bandage.  The patient tolerated well.  Instructions:  The patient was instructed to remove the dressing in 24 hours and that some bruising is to be expected.  She was advised to use over the counter analgesics as needed for any pain at the site.  She is to keep the area dry for 24 hours and to call if her hand or arm becomes cold, numb, or blue.  Return visit:  Return in 2 weeks   Brock Bad, MD 09/27/2020 2:57 PM

## 2020-10-03 ENCOUNTER — Other Ambulatory Visit: Payer: Self-pay

## 2020-10-03 ENCOUNTER — Other Ambulatory Visit (HOSPITAL_COMMUNITY)
Admission: RE | Admit: 2020-10-03 | Discharge: 2020-10-03 | Disposition: A | Discharge status: Home / Self Care | Payer: Medicaid Other | Source: Ambulatory Visit | Attending: Advanced Practice Midwife | Admitting: Advanced Practice Midwife

## 2020-10-03 ENCOUNTER — Encounter: Payer: Self-pay | Admitting: Advanced Practice Midwife

## 2020-10-03 ENCOUNTER — Ambulatory Visit (INDEPENDENT_AMBULATORY_CARE_PROVIDER_SITE_OTHER): Payer: Medicaid Other | Admitting: Advanced Practice Midwife

## 2020-10-03 VITALS — BP 122/56 | HR 74 | Ht 65.0 in | Wt 184.0 lb

## 2020-10-03 DIAGNOSIS — Z124 Encounter for screening for malignant neoplasm of cervix: Secondary | ICD-10-CM | POA: Diagnosis present

## 2020-10-03 DIAGNOSIS — Z789 Other specified health status: Secondary | ICD-10-CM | POA: Insufficient documentation

## 2020-10-03 DIAGNOSIS — M5431 Sciatica, right side: Secondary | ICD-10-CM | POA: Insufficient documentation

## 2020-10-03 NOTE — Patient Instructions (Signed)
Sciatica  Sciatica is pain, numbness, weakness, or tingling along the path of the sciatic nerve. The sciatic nerve starts in the lower back and runs down the back of each leg. The nerve controls the muscles in the lower leg and in the back of the knee. It also provides feeling (sensation) to the back of the thigh, the lower leg, and the sole of the foot. Sciatica is a symptom of another medical condition that pinches or puts pressure on the sciatic nerve. Sciatica most often only affects one side of the body. Sciatica usually goes away on its own or with treatment. In some cases, sciatica may come back (recur). What are the causes? This condition is caused by pressure on the sciatic nerve or pinching of the nerve. This may be the result of:  A disk in between the bones of the spine bulging out too far (herniated disk).  Age-related changes in the spinal disks.  A pain disorder that affects a muscle in the buttock.  Extra bone growth near the sciatic nerve.  A break (fracture) of the pelvis.  Pregnancy.  Tumor. This is rare. What increases the risk? The following factors may make you more likely to develop this condition:  Playing sports that place pressure or stress on the spine.  Having poor strength and flexibility.  A history of back injury or surgery.  Sitting for long periods of time.  Doing activities that involve repetitive bending or lifting.  Obesity. What are the signs or symptoms? Symptoms can vary from mild to very severe, and they may include:  Any of these problems in the lower back, leg, hip, or buttock: ? Mild tingling, numbness, or dull aches. ? Burning sensations. ? Sharp pains.  Numbness in the back of the calf or the sole of the foot.  Leg weakness.  Severe back pain that makes movement difficult. Symptoms may get worse when you cough, sneeze, or laugh, or when you sit or stand for long periods of time. How is this diagnosed? This condition may be  diagnosed based on:  Your symptoms and medical history.  A physical exam.  Blood tests.  Imaging tests, such as: ? X-rays. ? MRI. ? CT scan. How is this treated? In many cases, this condition improves on its own without treatment. However, treatment may include:  Reducing or modifying physical activity.  Exercising and stretching.  Icing and applying heat to the affected area.  Medicines that help to: ? Relieve pain and swelling. ? Relax your muscles.  Injections of medicines that help to relieve pain, irritation, and inflammation around the sciatic nerve (steroids).  Surgery. Follow these instructions at home: Medicines  Take over-the-counter and prescription medicines only as told by your health care provider.  Ask your health care provider if the medicine prescribed to you: ? Requires you to avoid driving or using heavy machinery. ? Can cause constipation. You may need to take these actions to prevent or treat constipation:  Drink enough fluid to keep your urine pale yellow.  Take over-the-counter or prescription medicines.  Eat foods that are high in fiber, such as beans, whole grains, and fresh fruits and vegetables.  Limit foods that are high in fat and processed sugars, such as fried or sweet foods. Managing pain  If directed, put ice on the affected area. ? Put ice in a plastic bag. ? Place a towel between your skin and the bag. ? Leave the ice on for 20 minutes, 2-3 times a day.    If directed, apply heat to the affected area. Use the heat source that your health care provider recommends, such as a moist heat pack or a heating pad. ? Place a towel between your skin and the heat source. ? Leave the heat on for 20-30 minutes. ? Remove the heat if your skin turns bright red. This is especially important if you are unable to feel pain, heat, or cold. You may have a greater risk of getting burned.      Activity  Return to your normal activities as told by  your health care provider. Ask your health care provider what activities are safe for you.  Avoid activities that make your symptoms worse.  Take brief periods of rest throughout the day. ? When you rest for longer periods, mix in some mild activity or stretching between periods of rest. This will help to prevent stiffness and pain. ? Avoid sitting for long periods of time without moving. Get up and move around at least one time each hour.  Exercise and stretch regularly, as told by your health care provider.  Do not lift anything that is heavier than 10 lb (4.5 kg) while you have symptoms of sciatica. When you do not have symptoms, you should still avoid heavy lifting, especially repetitive heavy lifting.  When you lift objects, always use proper lifting technique, which includes: ? Bending your knees. ? Keeping the load close to your body. ? Avoiding twisting.   General instructions  Maintain a healthy weight. Excess weight puts extra stress on your back.  Wear supportive, comfortable shoes. Avoid wearing high heels.  Avoid sleeping on a mattress that is too soft or too hard. A mattress that is firm enough to support your back when you sleep may help to reduce your pain.  Keep all follow-up visits as told by your health care provider. This is important. Contact a health care provider if:  You have pain that: ? Wakes you up when you are sleeping. ? Gets worse when you lie down. ? Is worse than you have experienced in the past. ? Lasts longer than 4 weeks.  You have an unexplained weight loss. Get help right away if:  You are not able to control when you urinate or have bowel movements (incontinence).  You have: ? Weakness in your lower back, pelvis, buttocks, or legs that gets worse. ? Redness or swelling of your back. ? A burning sensation when you urinate. Summary  Sciatica is pain, numbness, weakness, or tingling along the path of the sciatic nerve.  This condition  is caused by pressure on the sciatic nerve or pinching of the nerve.  Sciatica can cause pain, numbness, or tingling in the lower back, legs, hips, and buttocks.  Treatment often includes rest, exercise, medicines, and applying ice or heat. This information is not intended to replace advice given to you by your health care provider. Make sure you discuss any questions you have with your health care provider. Document Revised: 08/10/2018 Document Reviewed: 08/10/2018 Elsevier Patient Education  2021 [employer] Inc.   Sciatica Rehab Ask your health care provider which exercises are safe for you. Do exercises exactly as told by your health care provider and adjust them as directed. It is normal to feel mild stretching, pulling, tightness, or discomfort as you do these exercises. Stop right away if you feel sudden pain or your pain gets worse. Do not begin these exercises until told by your health care provider. Stretching and range-of-motion exercises These   exercises warm up your muscles and joints and improve the movement and flexibility of your hips and back. These exercises also help to relieve pain, numbness, and tingling. Sciatic nerve glide 1. Sit in a chair with your head facing down toward your chest. Place your hands behind your back. Let your shoulders slump forward. 2. Slowly straighten one of your legs while you tilt your head back as if you are looking toward the ceiling. Only straighten your leg as far as you can without making your symptoms worse. 3. Hold this position for __________ seconds. 4. Slowly return to the starting position. 5. Repeat with your other leg. Repeat __________ times. Complete this exercise __________ times a day. Knee to chest with hip adduction and internal rotation 1. Lie on your back on a firm surface with both legs straight. 2. Bend one of your knees and move it up toward your chest until you feel a gentle stretch in your lower back and buttock. Then, move  your knee toward the shoulder that is on the opposite side from your leg. This is hip adduction and internal rotation. ? Hold your leg in this position by holding on to the front of your knee. 3. Hold this position for __________ seconds. 4. Slowly return to the starting position. 5. Repeat with your other leg. Repeat __________ times. Complete this exercise __________ times a day.   Prone extension on elbows 1. Lie on your abdomen on a firm surface. A bed may be too soft for this exercise. 2. Prop yourself up on your elbows. 3. Use your arms to help lift your chest up until you feel a gentle stretch in your abdomen and your lower back. ? This will place some of your body weight on your elbows. If this is uncomfortable, try stacking pillows under your chest. ? Your hips should stay down, against the surface that you are lying on. Keep your hip and back muscles relaxed. 4. Hold this position for __________ seconds. 5. Slowly relax your upper body and return to the starting position. Repeat __________ times. Complete this exercise __________ times a day.   Strengthening exercises These exercises build strength and endurance in your back. Endurance is the ability to use your muscles for a long time, even after they get tired. Pelvic tilt This exercise strengthens the muscles that lie deep in the abdomen. 1. Lie on your back on a firm surface. Bend your knees and keep your feet flat on the floor. 2. Tense your abdominal muscles. Tip your pelvis up toward the ceiling and flatten your lower back into the floor. ? To help with this exercise, you may place a small towel under your lower back and try to push your back into the towel. 3. Hold this position for __________ seconds. 4. Let your muscles relax completely before you repeat this exercise. Repeat __________ times. Complete this exercise __________ times a day. Alternating arm and leg raises 1. Get on your hands and knees on a firm surface.  If you are on a hard floor, you may want to use padding, such as an exercise mat, to cushion your knees. 2. Line up your arms and legs. Your hands should be directly below your shoulders, and your knees should be directly below your hips. 3. Lift your left leg behind you. At the same time, raise your right arm and straighten it in front of you. ? Do not lift your leg higher than your hip. ? Do not lift your arm   higher than your shoulder. ? Keep your abdominal and back muscles tight. ? Keep your hips facing the ground. ? Do not arch your back. ? Keep your balance carefully, and do not hold your breath. 4. Hold this position for __________ seconds. 5. Slowly return to the starting position. 6. Repeat with your right leg and your left arm. Repeat __________ times. Complete this exercise __________ times a day.   Posture and body mechanics Good posture and healthy body mechanics can help to relieve stress in your body's tissues and joints. Body mechanics refers to the movements and positions of your body while you do your daily activities. Posture is part of body mechanics. Good posture means:  Your spine is in its natural S-curve position (neutral).  Your shoulders are pulled back slightly.  Your head is not tipped forward. Follow these guidelines to improve your posture and body mechanics in your everyday activities. Standing  When standing, keep your spine neutral and your feet about hip width apart. Keep a slight bend in your knees. Your ears, shoulders, and hips should line up.  When you do a task in which you stand in one place for a long time, place one foot up on a stable object that is 2-4 inches (5-10 cm) high, such as a footstool. This helps keep your spine neutral.   Sitting  When sitting, keep your spine neutral and keep your feet flat on the floor. Use a footrest, if necessary, and keep your thighs parallel to the floor. Avoid rounding your shoulders, and avoid tilting your  head forward.  When working at a desk or a computer, keep your desk at a height where your hands are slightly lower than your elbows. Slide your chair under your desk so you are close enough to maintain good posture.  When working at a computer, place your monitor at a height where you are looking straight ahead and you do not have to tilt your head forward or downward to look at the screen.   Resting  When lying down and resting, avoid positions that are most painful for you.  If you have pain with activities such as sitting, bending, stooping, or squatting, lie in a position in which your body does not bend very much. For example, avoid curling up on your side with your arms and knees near your chest (fetal position).  If you have pain with activities such as standing for a long time or reaching with your arms, lie with your spine in a neutral position and bend your knees slightly. Try the following positions: ? Lying on your side with a pillow between your knees. ? Lying on your back with a pillow under your knees. Lifting  When lifting objects, keep your feet at least shoulder width apart and tighten your abdominal muscles.  Bend your knees and hips and keep your spine neutral. It is important to lift using the strength of your legs, not your back. Do not lock your knees straight out.  Always ask for help to lift heavy or awkward objects.   This information is not intended to replace advice given to you by your health care provider. Make sure you discuss any questions you have with your health care provider. Document Revised: 11/13/2018 Document Reviewed: 08/13/2018 Elsevier Patient Education  2021 Elsevier Inc.  

## 2020-10-03 NOTE — Progress Notes (Signed)
Post Partum Visit Note  Donna Simon is a 20 y.o. G11P1001 female who presents for a postpartum visit. She is 4 weeks postpartum following a normal spontaneous vaginal delivery.  I have fully reviewed the prenatal and intrapartum course. The delivery was at 37.2 gestational weeks.  Anesthesia: epidural. Postpartum course has been unremarkable. Baby is doing well. Baby is feeding by breast. Bleeding discharge. Bowel function is normal. Bladder function is normal. Patient is not sexually active. Contraception method is condoms. Postpartum depression screening: negative. Edinburgh scale: 0   The pregnancy intention screening data noted above was reviewed. Potential methods of contraception were discussed. The patient elected to proceed with FAM or LAM.    Edinburgh Postnatal Depression Scale - 10/03/20 1103      Edinburgh Postnatal Depression Scale:  In the Past 7 Days   I have been able to laugh and see the funny side of things. 0    I have looked forward with enjoyment to things. 0    I have blamed myself unnecessarily when things went wrong. 0    I have been anxious or worried for no good reason. 0    I have felt scared or panicky for no good reason. 0    Things have been getting on top of me. 0    I have been so unhappy that I have had difficulty sleeping. 0    I have felt sad or miserable. 0    I have been so unhappy that I have been crying. 0    The thought of harming myself has occurred to me. 0    Edinburgh Postnatal Depression Scale Total 0            The following portions of the patient's history were reviewed and updated as appropriate: allergies, current medications, past family history, past medical history, past social history, past surgical history and problem list.  Review of Systems Pertinent items noted in HPI and remainder of comprehensive ROS otherwise negative.    Objective:  BP (!) 122/56 (BP Location: Right Arm, Patient Position: Sitting, Cuff Size:  Normal)   Pulse 74   Ht 5\' 5"  (1.651 m)   Wt 184 lb (83.5 kg)   LMP  (LMP Unknown)   Breastfeeding Yes   BMI 30.62 kg/m    VS reviewed, nursing note reviewed,  Constitutional: well developed, well nourished, no distress HEENT: normocephalic CV: normal rate Pulm/chest wall: normal effort Breast Exam: Deferred Neuro: alert and oriented x 3 Skin: warm, dry Psych: affect normal Pelvic exam: Cervix pink, visually closed, without lesion, scant white creamy discharge, vaginal walls and external genitalia normal Bimanual exam: Deferred  Assessment:   Normal postpartum exam. Pap smear done at today's visit. Pt was too young at initial prenatal visit.  Plan:   Essential components of care per ACOG recommendations:  1.  Mood and well being: Patient with negative depression screening today. Reviewed local resources for support.  - Patient does not use tobacco.  - hx of drug use? No   2. Infant care and feeding:  -Patient currently breastmilk feeding? Yes --If breastmilk feeding discussed return to work and pumping. If needed, patient was provided letter for work to allow for every 2-3 hr pumping breaks, and to be granted a private location to express breastmilk and refrigerated area to store breastmilk. Reviewed importance of draining breast regularly to support lactation. -Social determinants of health (SDOH) reviewed in EPIC. No concerns  3. Sexuality, contraception and birth spacing -  Patient does not want a pregnancy in the next year.   - Reviewed forms of contraception in tiered fashion. Patient desired natural family planning/lactational amenorrhea for now.  Discussed recommendations to improve effectiveness of LAM including breastfeeding every 4 hours, less use of the breastpump, and exclusive breastfeeding without formula or other foods.  Pt had Paragard before pregnancy but her uterus sounded small and Paragard caused pain.  Discussed how uterus may be larger postpartum and she  could try Paragard again if desired.  Pt to consider in the future. today.     4. Sleep and fatigue -Encouraged family/partner/community support of 4 hrs of uninterrupted sleep to help with mood and fatigue  5. Physical Recovery  - Discussed patients delivery without complications - Patient had no laceration, perineal healing reviewed. Patient expressed understanding - Patient has urinary incontinence? No  --Pt reports tingling/numbness down right leg, especially when exercising .Reviewed slow return to exercise and treatments for sciatic including stretches.  Refer to PT.   - Patient is safe to resume physical and sexual activity  6.  Health Maintenance ---No Pap hx, performed today  7. Chronic Disease - PCP follow up  Sharen Counter, CNM [facility] [employer], [facility]

## 2020-10-04 LAB — CYTOLOGY - PAP: Diagnosis: NEGATIVE

## 2020-11-16 ENCOUNTER — Other Ambulatory Visit: Payer: Self-pay

## 2020-11-16 DIAGNOSIS — O219 Vomiting of pregnancy, unspecified: Secondary | ICD-10-CM | POA: Insufficient documentation

## 2020-11-16 DIAGNOSIS — Z3A00 Weeks of gestation of pregnancy not specified: Secondary | ICD-10-CM | POA: Diagnosis not present

## 2020-11-17 ENCOUNTER — Emergency Department (HOSPITAL_COMMUNITY)
Admission: EM | Admit: 2020-11-17 | Discharge: 2020-11-17 | Disposition: A | Discharge status: Home / Self Care | Payer: Medicaid Other | Attending: Emergency Medicine | Admitting: Emergency Medicine

## 2020-11-17 ENCOUNTER — Other Ambulatory Visit: Payer: Self-pay

## 2020-11-17 ENCOUNTER — Encounter (HOSPITAL_COMMUNITY): Payer: Self-pay | Admitting: Advanced Practice Midwife

## 2020-11-17 DIAGNOSIS — R112 Nausea with vomiting, unspecified: Secondary | ICD-10-CM

## 2020-11-17 DIAGNOSIS — Z3490 Encounter for supervision of normal pregnancy, unspecified, unspecified trimester: Secondary | ICD-10-CM

## 2020-11-17 LAB — BASIC METABOLIC PANEL
Anion gap: 8 (ref 5–15)
BUN: 14 mg/dL (ref 6–20)
CO2: 20 mmol/L — ABNORMAL LOW (ref 22–32)
Calcium: 8.7 mg/dL — ABNORMAL LOW (ref 8.9–10.3)
Chloride: 109 mmol/L (ref 98–111)
Creatinine, Ser: 0.62 mg/dL (ref 0.44–1.00)
GFR, Estimated: >60 mL/min (ref >60)
Glucose, Bld: 87 mg/dL (ref 70–99)
Potassium: 3.8 mmol/L (ref 3.5–5.1)
Sodium: 137 mmol/L (ref 135–145)

## 2020-11-17 LAB — I-STAT BETA HCG BLOOD, ED (MC, WL, AP ONLY): I-stat hCG, quantitative: 64.6 m[IU]/mL — ABNORMAL HIGH (ref <5)

## 2020-11-17 MED ORDER — PROMETHAZINE HCL 25 MG PO TABS: 25.0000 mg | ORAL_TABLET | Freq: Four times a day (QID) | ORAL | 0 refills | Status: DC | PRN

## 2020-11-17 MED ORDER — DOXYLAMINE-PYRIDOXINE 10-10 MG PO TBEC: 2.0000 | DELAYED_RELEASE_TABLET | Freq: Two times a day (BID) | ORAL | 0 refills | Status: DC | PRN

## 2020-11-17 NOTE — ED Provider Notes (Signed)
[facility] EMERGENCY DEPARTMENT Provider Note   CSN: [id number] Arrival date & time: 11/16/20  2343     History Chief Complaint  Patient presents with  . Emesis    Donna Simon is a 20 y.o. female.  Patient to ED with complaint of nausea and vomiting for the past week, and breast tenderness. She is 11 weeks post-partum and currently breast feeding. She had a Nexplannon implanted after delivery but had it removed due to adverse affects. She reports she has had intermittent vaginal bleeding since deliver that she thought was a return to normal menses. This last week, she had abnormal flow duration of 1 day. She denies significant abdominal pain. No vaginal bleeding now. She did not take a home pregnancy test prior to arrival.   The history is provided by the patient. No language interpreter was used.  Emesis Associated symptoms: no chills and no fever        No past medical history on file.  Patient Active Problem List   Diagnosis Date Noted  . Sciatica of right side 10/03/2020  . Uses lactation amenorrhea method for contraception 10/03/2020    Past Surgical History:  Procedure Laterality Date  . APPENDECTOMY Right 2009     OB History    Gravida  2   Para  1   Term  1   Preterm      AB      Living  1     SAB      IAB      Ectopic      Multiple  0   Live Births  1           Family History  Problem Relation Age of Onset  . Hypertension Mother   . Obesity Mother   . Miscarriages / [place] Mother   . Obesity Father     Social History   Tobacco Use  . Smoking status: Never Smoker  . Smokeless tobacco: Never Used  Vaping Use  . Vaping Use: Never used  Substance Use Topics  . Alcohol use: Not Currently  . Drug use: Never    Home Medications Prior to Admission medications   Medication Sig Start Date End Date Taking? Authorizing Provider  Prenatal Vit-Fe Fumarate-FA (PRENATAL MULTIVITAMIN) TABS tablet Take 1  tablet by mouth daily at 12 noon.    [provider]    Allergies    Patient has no known allergies.  Review of Systems   Review of Systems  Constitutional: Negative for chills and fever.  HENT: Negative.   Respiratory: Negative.   Cardiovascular: Negative.   Gastrointestinal: Positive for nausea and vomiting.  Genitourinary: Positive for menstrual problem.       Breast tenderness.   Musculoskeletal: Negative.   Skin: Negative.   Neurological: Negative.     Physical Exam Updated Vital Signs BP 128/67 (BP Location: Left Arm)   Pulse 79   Temp 98.5 F (36.9 C) (Oral)   Resp 16   Ht 5\' 5"  (1.651 m)   Wt 79.8 kg   LMP  (LMP Unknown)   SpO2 99%   BMI 29.29 kg/m   Physical Exam Vitals and nursing note reviewed.  Constitutional:      Appearance: Normal appearance. She is normal weight.  Pulmonary:     Effort: Pulmonary effort is normal.  Abdominal:     Palpations: Abdomen is soft.     Tenderness: There is no abdominal tenderness.  Skin:  General: Skin is warm and dry.  Neurological:     Mental Status: She is alert and oriented to person, place, and time.     ED Results / Procedures / Treatments   Labs (all labs ordered are listed, but only abnormal results are displayed) Labs Reviewed  BASIC METABOLIC PANEL - Abnormal; Notable for the following components:      Result Value   CO2 20 (*)    Calcium 8.7 (*)    All other components within normal limits  I-STAT BETA HCG BLOOD, ED ([facility], [facility], [facility] ONLY) - Abnormal; Notable for the following components:   I-stat hCG, quantitative 64.6 (*)    All other components within normal limits    EKG None  Radiology No results found.  Procedures Procedures   Medications Ordered in ED Medications - No data to display  ED Course  I have reviewed the triage vital signs and the nursing notes.  Pertinent labs & imaging results that were available during my care of the patient were reviewed by me and considered  in my medical decision making (see chart for details).    MDM Rules/Calculators/A&P                          Patient to ED with ss/sxs as per HPI.  I-stat HCG positive with result of 64.6. This was discussed with the patient. She is not having abdominal pain, vaginal bleeding. Suspect early pregnancy. Recommended outpatient follow up with her OB/GYN.   The patient was offered evaluation in the MAU but she states she is comfortable with outpatient follow up. Strict return precautions discussed.   Rx Diclegis and Phenergan provided for nausea.   Final Clinical Impression(s) / ED Diagnoses Final diagnoses:  None   1. Nausea and vomiting 2. Pregnant   Rx / DC Orders ED Discharge Orders    None       Elpidio Anis, Cordelia Poche 11/17/20 0554    Zadie Rhine, MD 11/17/20 2308

## 2020-11-17 NOTE — ED Provider Notes (Signed)
MSE was initiated and I personally evaluated the patient and placed orders (if any) at  12:48 AM on November 17, 2020.  Patient to ED with nausea, vomiting x 1 week, breast tenderness. She is 11-weeks post-partum. Has been having irregular menses, the last this past week was one day, which was unusual. She is concerned she is pregnant.   Today's Vitals   11/17/20 0025 11/17/20 0045  BP: 128/67   Pulse: 79   Resp: 16   Temp: 98.5 F (36.9 C)   TempSrc: Oral   SpO2: 99%   Weight:  79.8 kg  Height:  5\' 5"  (1.651 m)  PainSc:  0-No pain   Body mass index is 29.29 kg/m.  VSS.  Very well appearing Abdomen nontender.  The patient appears stable so that the remainder of the MSE may be completed by another provider.   , PA-C 11/17/20 0049    11/19/20, MD 11/17/20 [id number]

## 2020-11-17 NOTE — Discharge Instructions (Addendum)
Follow up with [facility] for repeat pregnancy test to insure normal progression.   If you develop any abdominal pain, vaginal bleeding, uncontrolled vomiting, please return to the ED for further evaluation.

## 2020-11-17 NOTE — ED Triage Notes (Signed)
Patient reports she is 11 weeks post-partum, reports nausea x 1 week, also with headache. States she is breast feeding.

## 2020-11-21 ENCOUNTER — Other Ambulatory Visit: Payer: Self-pay

## 2020-11-21 ENCOUNTER — Inpatient Hospital Stay (HOSPITAL_COMMUNITY)
Admission: AD | Admit: 2020-11-21 | Discharge: 2020-11-21 | Disposition: A | Discharge status: Home / Self Care | Payer: Medicaid Other | Attending: Obstetrics and Gynecology | Admitting: Obstetrics and Gynecology

## 2020-11-21 ENCOUNTER — Encounter (HOSPITAL_COMMUNITY): Payer: Self-pay | Admitting: Obstetrics and Gynecology

## 2020-11-21 ENCOUNTER — Inpatient Hospital Stay (HOSPITAL_COMMUNITY): Payer: Medicaid Other

## 2020-11-21 DIAGNOSIS — O039 Complete or unspecified spontaneous abortion without complication: Secondary | ICD-10-CM | POA: Diagnosis not present

## 2020-11-21 DIAGNOSIS — Z6790 Unspecified blood type, Rh positive: Secondary | ICD-10-CM

## 2020-11-21 DIAGNOSIS — O209 Hemorrhage in early pregnancy, unspecified: Secondary | ICD-10-CM | POA: Diagnosis present

## 2020-11-21 DIAGNOSIS — O4690 Antepartum hemorrhage, unspecified, unspecified trimester: Secondary | ICD-10-CM

## 2020-11-21 DIAGNOSIS — Z3A01 Less than 8 weeks gestation of pregnancy: Secondary | ICD-10-CM | POA: Diagnosis not present

## 2020-11-21 LAB — URINALYSIS, ROUTINE W REFLEX MICROSCOPIC
Bilirubin Urine: NEGATIVE
Glucose, UA: NEGATIVE mg/dL
Hgb urine dipstick: NEGATIVE
Ketones, ur: NEGATIVE mg/dL
Leukocytes,Ua: NEGATIVE
Nitrite: NEGATIVE
Protein, ur: NEGATIVE mg/dL
Specific Gravity, Urine: 1.025 (ref 1.005–1.030)
pH: 5 (ref 5.0–8.0)

## 2020-11-21 LAB — WET PREP, GENITAL
Clue Cells Wet Prep HPF POC: NONE SEEN
Sperm: NONE SEEN
Trich, Wet Prep: NONE SEEN
Yeast Wet Prep HPF POC: NONE SEEN

## 2020-11-21 LAB — COMPREHENSIVE METABOLIC PANEL
ALT: 29 U/L (ref 0–44)
AST: 26 U/L (ref 15–41)
Albumin: 3.6 g/dL (ref 3.5–5.0)
Alkaline Phosphatase: 55 U/L (ref 38–126)
Anion gap: 7 (ref 5–15)
BUN: 14 mg/dL (ref 6–20)
CO2: 27 mmol/L (ref 22–32)
Calcium: 9.7 mg/dL (ref 8.9–10.3)
Chloride: 105 mmol/L (ref 98–111)
Creatinine, Ser: 0.79 mg/dL (ref 0.44–1.00)
GFR, Estimated: >60 mL/min (ref >60)
Glucose, Bld: 90 mg/dL (ref 70–99)
Potassium: 4.8 mmol/L (ref 3.5–5.1)
Sodium: 139 mmol/L (ref 135–145)
Total Bilirubin: 0.6 mg/dL (ref 0.3–1.2)
Total Protein: 6.8 g/dL (ref 6.5–8.1)

## 2020-11-21 LAB — CBC
HCT: 38.8 % (ref 36.0–46.0)
Hemoglobin: 12.6 g/dL (ref 12.0–15.0)
MCH: 29.8 pg (ref 26.0–34.0)
MCHC: 32.5 g/dL (ref 30.0–36.0)
MCV: 91.7 fL (ref 80.0–100.0)
Platelets: 221 10*3/uL (ref 150–400)
RBC: 4.23 MIL/uL (ref 3.87–5.11)
RDW: 15 % (ref 11.5–15.5)
WBC: 5.5 10*3/uL (ref 4.0–10.5)
nRBC: 0 % (ref 0.0–0.2)

## 2020-11-21 LAB — HCG, QUANTITATIVE, PREGNANCY: hCG, Beta Chain, Quant, S: 19 m[IU]/mL — ABNORMAL HIGH (ref <5)

## 2020-11-21 NOTE — MAU Note (Signed)
.   Donna Simon is a 20 y.o. at [redacted]w[redacted]d here in MAU reporting: Vaginal bleeding that started on 11/18/20. States that it started out light pink and then was heavier that evening like the start of a period. Was not soaking through pads. Is having constant lower abdominal cramping that started on Saturday as well. Denies any abnormal vaginal discharge besides the bleeding. Last intercourse was on 11/19/20.   LMP: 10/17/20 Pain score: 3 Vitals:   11/21/20 1152  BP: (!) 128/59  Pulse: 67  Resp: 16  Temp: 98.5 F (36.9 C)  SpO2: 100%      Lab orders placed from triage: UA

## 2020-11-21 NOTE — Discharge Instructions (Signed)
Miscarriage A miscarriage is the loss of pregnancy before the 20th week. Most miscarriages happen during the first 3 months of pregnancy. Sometimes, a miscarriage can happen before a woman knows that she is pregnant. Having a miscarriage can be an emotional experience. If you have had a miscarriage, talk with your health care provider about any questions you may have about the loss of your baby, the grieving process, and your plans for future pregnancy. What are the causes? Many times, the cause of a miscarriage is not known. What increases the risk? The following factors may make a pregnant woman more likely to have a miscarriage: Certain medical conditions  Conditions that affect the hormone balance in the body, such as thyroid disease or polycystic ovary syndrome.  Diabetes.  Autoimmune disorders.  Infections.  Bleeding disorders.  Obesity. Lifestyle factors  Using products with tobacco or nicotine in them or being exposed to tobacco smoke.  Having alcohol.  Having large amounts of caffeine.  Recreational drug use. Problems with reproductive organs or structures  Cervical insufficiency. This is when the lowest part of the uterus (cervix) opens and thins before pregnancy is at term.  Having a condition called Asherman syndrome. This syndrome causes scarring in the uterus or causes the uterus to be abnormal in structure.  Fibrous growths, called fibroids, in the uterus.  Congenital abnormalities. These problems are present at birth.  Infection of the cervix or uterus. Personal or medical history  Injury (trauma).  Having had a miscarriage before.  Being younger than age 18 or older than age 35.  Exposure to harmful substances in the environment. This may include radiation or heavy metals, such as lead.  Use of certain medicines. What are the signs or symptoms? Symptoms of this condition include:  Vaginal bleeding or spotting, with or without cramps or  pain.  Pain or cramping in the abdomen or lower back.  Fluid or tissue coming out of the vagina. How is this diagnosed? This condition may be diagnosed based on:  A physical exam.  Ultrasound.  Lab tests, such as blood tests, urine tests, or swabs for infection. How is this treated? Treatment for a miscarriage is sometimes not needed if all the pregnancy tissue that was in the uterus comes out on its own, and there are no other problems such as infection or heavy bleeding. In other cases, this condition may be treated with:  Dilation and curettage (D&C). In this procedure, the cervix is stretched open and any remaining pregnancy tissue is removed from the lining of the uterus (endometrium).  Medicines. These may include: ? Antibiotic medicine, to treat infection. ? Medicine to help any remaining pregnancy tissue come out of the body. ? Medicine to reduce (contract) the size of the uterus. These medicines may be given if there is a lot of bleeding. If you have Rh-negative blood, you may be given an injection of a medicine called Rho(D) immune globulin. This medicine helps prevent problems with future pregnancies. Follow these instructions at home: Medicines  Take over-the-counter and prescription medicines only as told by your health care provider.  If you were prescribed antibiotic medicine, take it as told by your health care provider. Do not stop taking the antibiotic even if you start to feel better. Activity  Rest as told by your health care provider. Ask your health care provider what activities are safe for you.  Have someone help with home and family responsibilities during this time. General instructions  Monitor how much tissue   or blood clot material comes out of the vagina.  Do not have sex, douche, or put anything, such as tampons, in your vagina until your health care provider says it is okay.  To help you and your partner with the grieving process, talk with your  health care provider or get counseling.  When you are ready, meet with your health care provider to discuss any important steps you should take for your health. Also, discuss steps you should take to have a healthy pregnancy in the future.  Keep all follow-up visits. This is important.   Where to find more information  The [employer] of Obstetricians and Gynecologists: acog.org  U.S. Department of Health and Programmer, systems of Women's Health: EverydayCosmetics.no Contact a health care provider if:  You have a fever or chills.  There is bad-smelling fluid coming from the vagina.  You have more bleeding instead of less.  Tissue or blood clots come out of your vagina. Get help right away if:  You have severe cramps or pain in your back or abdomen.  Heavy bleeding soaks through 2 large sanitary pads an hour for more than 2 hours.  You become light-headed or weak.  You faint.  You feel sad, and your sadness takes over your thoughts.  You think about hurting yourself. If you ever feel like you may hurt yourself or others, or have thoughts about taking your own life, get help right away. Go to your nearest emergency department or:  Call your local emergency services (911 in the [place]  Call a suicide crisis helpline, such as the [place] [facility] at 805-271-1892. This is open 24 hours a day in the [place]  Text the Crisis Text Line at 754 719 2970 (in the [place].). Summary  Most miscarriages happen in the first 3 months of pregnancy. Sometimes miscarriage happens before a woman knows that she is pregnant.  Follow instructions from your health care provider about medicines and activity.  To help you and your partner with grieving, talk with your health care provider or get counseling.  Keep all follow-up visits. This information is not intended to replace advice given to you by your health care provider. Make sure you discuss any questions you  have with your health care provider. Document Revised: 01/21/2020 Document Reviewed: 01/21/2020 Elsevier Patient Education  2021 [employer].         Managing Pregnancy Loss Pregnancy loss can happen any time during a pregnancy. Often the cause is not known. It is rarely because of anything you did. Pregnancy loss in early pregnancy (during the first trimester) is called a miscarriage. This type of pregnancy loss is the most common. Pregnancy loss that happens after 20 weeks of pregnancy is called fetal demise if the baby's heart stops beating before birth. Fetal demise is much less common. Some women experience spontaneous labor shortly after fetal demise resulting in a stillborn birth (stillbirth). Any pregnancy loss can be devastating. You will need to recover both physically and emotionally. Most women are able to get pregnant again after a pregnancy loss and deliver a healthy baby. How to manage emotional recovery Pregnancy loss is very hard emotionally. You may feel many different emotions while you grieve. You may feel sad and angry. You may also feel guilty. It is normal to have periods of crying. Emotional recovery can take longer than physical recovery. It is different for everyone. Taking these steps can help you in managing this loss:  Remember that it is  unlikely you did anything to cause the pregnancy loss.  Share your thoughts and feelings with friends, family, and your partner. Remember that your partner is also recovering emotionally.  Make sure you have a good support system. Do not spend too much time alone.  Meet with a pregnancy loss counselor or join a pregnancy loss support group.  Get enough sleep and eat a healthy diet. Return to regular exercise when you have recovered physically.  Do not use drugs or alcohol to manage your emotions.  Consider seeing a mental health professional to help you recover emotionally.  Ask a friend or loved one to help you decide  what to do with any clothing and nursery items you received for your baby. In the case of a stillbirth, many women benefit from taking additional steps in the grieving process. You may want to:  Hold your baby after the birth.  Name your baby.  Request a birth certificate.  Create a keepsake such as handprints or footprints.  Dress your baby and have a picture taken.  Make funeral arrangements.  Ask for a baptism or blessing. Hospitals have staff members who can help you with all these arrangements.   How to recognize emotional stress It is normal to have emotional stress after a pregnancy loss. But emotional stress that lasts a long time or becomes severe requires treatment. Watch out for these signs of severe emotional stress:  Sadness, anger, or guilt that is not going away and is interfering with your normal activities.  Relationship problems that have occurred or gotten worse since the pregnancy loss.  Signs of depression that last longer than 2 weeks. These may include: ? Sadness. ? Anxiety. ? Hopelessness. ? Loss of interest in activities you enjoy. ? Inability to concentrate. ? Trouble sleeping or sleeping too much. ? Loss of appetite or overeating. ? Thoughts of death or of hurting yourself. Follow these instructions at home:  Take over-the-counter and prescription medicines only as told by your health care provider.  Rest at home until your energy level returns. Return to your normal activities as told by your health care provider. Ask your health care provider what activities are safe for you.  When you are ready, meet with your health care provider to discuss steps to take for a future pregnancy.  Keep all follow-up visits as told by your health care provider. This is important. Where to find support  To help you and your partner with the process of grieving, talk with your health care provider or seek counseling.  Consider meeting with others who have  experienced pregnancy loss. Ask your health care provider about support groups and resources. Where to find more information  U.S. Department of Health and Programmer, systems on Women's Health: [place]s.tn  American Pregnancy Association: www.americanpregnancy.org Contact a health care provider if:  You continue to experience grief, sadness, or lack of motivation for everyday activities, and those feelings do not improve over time.  You are struggling to recover emotionally, especially if you are using alcohol or substances to help. Get help right away if:  You have thoughts of hurting yourself or others. If you ever feel like you may hurt yourself or others, or have thoughts about taking your own life, get help right away. You can go to your nearest emergency department or call:  Your local emergency services (911 in the [place]  A suicide crisis helpline, such as the [facility] at 414-184-1898. This is open 24  Any pregnancy loss can be difficult physically and emotionally.  You may experience many different emotions while you grieve. Emotional recovery can last longer than physical recovery.  It is normal to have emotional stress after a pregnancy loss. But emotional stress that lasts a long time or becomes severe requires treatment.  See your health care provider if you are struggling emotionally after a pregnancy loss. This information is not intended to replace advice given to you by your health care provider. Make sure you discuss any questions you have with your health care provider. Document Revised: 11/11/2018 Document Reviewed: 10/02/2017 Elsevier Patient Education  2021 Elsevier Inc.   

## 2020-11-21 NOTE — MAU Provider Note (Signed)
History     CSN: [id number]  Arrival date and time: 11/21/20 1136   Event Date/Time   First Provider Initiated Contact with Patient 11/21/20 1241      Chief Complaint  Patient presents with  . Vaginal Bleeding   Ms. Donna Simon is a 20 y.o. G2P1001 at [redacted]w[redacted]d who presents to MAU for vaginal bleeding which began 11/17/2020. Patient was seen in the ED at that time for nausea and vomiting with a positive pregnancy test. Pt reported intermittent bleeding, but because she was not having any vaginal bleeding at that time per ED note, no additional work-up was completed. Patient reports she has continued to have intermittent bleeding that is lighter than a period and reports tracking her bleeding with urine pregnancy tests at home that continued to get lighter and lighter lines. Patient is concerned that she is having a miscarriage. Of note, pt had most recent delivery on 09/01/2020, received a ppartum Nexplanon and had it removed on 09/27/2020.  Pt denies vaginal discharge/odor/itching. Pt denies N/V, abdominal pain, constipation, diarrhea, or urinary problems. Pt denies fever, chills, fatigue, sweating or changes in appetite. Pt denies SOB or chest pain. Pt denies dizziness, HA, light-headedness, weakness.   OB History    Gravida  2   Para  1   Term  1   Preterm      AB      Living  1     SAB      IAB      Ectopic      Multiple  0   Live Births  1           History reviewed. No pertinent past medical history.  Past Surgical History:  Procedure Laterality Date  . APPENDECTOMY Right 2009    Family History  Problem Relation Age of Onset  . Hypertension Mother   . Obesity Mother   . Miscarriages / [place] Mother   . Obesity Father     Social History   Tobacco Use  . Smoking status: Never Smoker  . Smokeless tobacco: Never Used  Vaping Use  . Vaping Use: Never used  Substance Use Topics  . Alcohol use: Not Currently  . Drug use: Never     Allergies: No Known Allergies  No medications prior to admission.    Review of Systems  Constitutional: Negative for chills, diaphoresis, fatigue and fever.  Eyes: Negative for visual disturbance.  Respiratory: Negative for shortness of breath.   Cardiovascular: Negative for chest pain.  Gastrointestinal: Negative for abdominal pain, constipation, diarrhea, nausea and vomiting.  Genitourinary: Positive for vaginal bleeding. Negative for dysuria, flank pain, frequency, pelvic pain, urgency and vaginal discharge.  Neurological: Negative for dizziness, weakness, light-headedness and headaches.   Physical Exam   Blood pressure (!) 118/48, pulse 68, temperature 98.5 F (36.9 C), temperature source Oral, resp. rate 14, weight 85.2 kg, last menstrual period 10/17/2020, SpO2 100 %, currently breastfeeding.  Patient Vitals for the past 24 hrs:  BP Temp Temp src Pulse Resp SpO2 Weight  11/21/20 1407 (!) 118/48 -- -- 68 14 -- --  11/21/20 1201 120/68 -- -- 89 -- -- --  11/21/20 1152 (!) 128/59 98.5 F (36.9 C) Oral 67 16 100 % 85.2 kg   Physical Exam Vitals and nursing note reviewed.  Constitutional:      General: She is not in acute distress.    Appearance: Normal appearance. She is not ill-appearing, toxic-appearing or diaphoretic.  HENT:  Head: Normocephalic and atraumatic.  Pulmonary:     Effort: Pulmonary effort is normal.  Neurological:     Mental Status: She is alert and oriented to person, place, and time.  Psychiatric:        Mood and Affect: Mood normal.        Behavior: Behavior normal.        Thought Content: Thought content normal.        Judgment: Judgment normal.    Results for orders placed or performed during the hospital encounter of 11/21/20 (from the past 24 hour(s))  Urinalysis, Routine w reflex microscopic Urine, Clean Catch     Status: Abnormal   Collection Time: 11/21/20 12:00 PM  Result Value Ref Range   Color, Urine YELLOW YELLOW   APPearance  HAZY (A) CLEAR   Specific Gravity, Urine 1.025 1.005 - 1.030   pH 5.0 5.0 - 8.0   Glucose, UA NEGATIVE NEGATIVE mg/dL   Hgb urine dipstick NEGATIVE NEGATIVE   Bilirubin Urine NEGATIVE NEGATIVE   Ketones, ur NEGATIVE NEGATIVE mg/dL   Protein, ur NEGATIVE NEGATIVE mg/dL   Nitrite NEGATIVE NEGATIVE   Leukocytes,Ua NEGATIVE NEGATIVE  Wet prep, genital     Status: Abnormal   Collection Time: 11/21/20 12:07 PM   Specimen: Vaginal  Result Value Ref Range   Yeast Wet Prep HPF POC NONE SEEN NONE SEEN   Trich, Wet Prep NONE SEEN NONE SEEN   Clue Cells Wet Prep HPF POC NONE SEEN NONE SEEN   WBC, Wet Prep HPF POC MODERATE (A) NONE SEEN   Sperm NONE SEEN   CBC     Status: None   Collection Time: 11/21/20 12:14 PM  Result Value Ref Range   WBC 5.5 4.0 - 10.5 K/uL   RBC 4.23 3.87 - 5.11 MIL/uL   Hemoglobin 12.6 12.0 - 15.0 g/dL   HCT 87.8 67.6 - 72.0 %   MCV 91.7 80.0 - 100.0 fL   MCH 29.8 26.0 - 34.0 pg   MCHC 32.5 30.0 - 36.0 g/dL   RDW 94.7 09.6 - 28.3 %   Platelets 221 150 - 400 K/uL   nRBC 0.0 0.0 - 0.2 %  Comprehensive metabolic panel     Status: None   Collection Time: 11/21/20 12:14 PM  Result Value Ref Range   Sodium 139 135 - 145 mmol/L   Potassium 4.8 3.5 - 5.1 mmol/L   Chloride 105 98 - 111 mmol/L   CO2 27 22 - 32 mmol/L   Glucose, Bld 90 70 - 99 mg/dL   BUN 14 6 - 20 mg/dL   Creatinine, Ser 6.62 0.44 - 1.00 mg/dL   Calcium 9.7 8.9 - 94.7 mg/dL   Total Protein 6.8 6.5 - 8.1 g/dL   Albumin 3.6 3.5 - 5.0 g/dL   AST 26 15 - 41 U/L   ALT 29 0 - 44 U/L   Alkaline Phosphatase 55 38 - 126 U/L   Total Bilirubin 0.6 0.3 - 1.2 mg/dL   GFR, Estimated >65 >46 mL/min   Anion gap 7 5 - 15  hCG, quantitative, pregnancy     Status: Abnormal   Collection Time: 11/21/20 12:14 PM  Result Value Ref Range   hCG, Beta Chain, Quant, S 19 (H) <5 mIU/mL   MAU Course  Procedures  MDM -VB, intermittent, lighter than a period -UA: hazy, otherwise WNL -CBC: WNL -CMP: WNL -[place]:  PUL -hCG: 19 (was 64.6 on 11/17/2020) -ABO: A Positive -WetPrep: WNL -GC/CT collected -pt  discharged to home in stable condition  Orders Placed This Encounter  Procedures  . Wet prep, genital    Standing Status:   Standing    Number of Occurrences:   1  . US OB LESS THAN 14 WEEKS WITH OB TRANSVAGINAL    Standing Status:   Standing    Number of Occurrences:   1    Order Specific Question:   Symptom/Reason for Exam    Answer:   Vaginal bleeding in pregnancy [[id number]]  . Urinalysis, Routine w reflex microscopic Urine, Clean Catch    Standing Status:   Standing    Number of Occurrences:   1  . CBC    Standing Status:   Standing    Number of Occurrences:   1  . Comprehensive metabolic panel    Standing Status:   Standing    Number of Occurrences:   1  . hCG, quantitative, pregnancy    Standing Status:   Standing    Number of Occurrences:   1  . Discharge patient    Order Specific Question:   Discharge disposition    Answer:   01-Home or Self Care [1]    Order Specific Question:   Discharge patient date    Answer:   11/21/2020   No orders of the defined types were placed in this encounter.  Assessment and Plan   1. Miscarriage   2. Vaginal bleeding in pregnancy   3. Blood type, Rh positive    Allergies as of 11/21/2020   No Known Allergies     Medication List    TAKE these medications   Doxylamine-Pyridoxine 10-10 MG Tbec Commonly known as: Diclegis Take 2 tablets by mouth every 12 (twelve) hours as needed.   prenatal multivitamin Tabs tablet Take 1 tablet by mouth daily at 12 noon.   promethazine 25 MG tablet Commonly known as: PHENERGAN Take 1 tablet (25 mg total) by mouth every 6 (six) hours as needed for nausea or vomiting.      -message sent to [facility] for SAB f/u in 1-2 weeks with hCG -return MAU precautions given -pt discharged to home in stable condition  Joni Reining E Tirrell Buchberger 11/21/2020, 2:42 PM

## 2020-11-22 LAB — GC/CHLAMYDIA PROBE AMP (~~LOC~~) NOT AT ARMC
Chlamydia: NEGATIVE
Comment: NEGATIVE
Comment: NORMAL
Neisseria Gonorrhea: NEGATIVE

## 2020-12-06 ENCOUNTER — Other Ambulatory Visit: Payer: Self-pay

## 2020-12-06 ENCOUNTER — Ambulatory Visit (INDEPENDENT_AMBULATORY_CARE_PROVIDER_SITE_OTHER): Payer: Medicaid Other | Admitting: Women's Health

## 2020-12-06 VITALS — BP 114/68 | HR 84 | Wt 184.0 lb

## 2020-12-06 DIAGNOSIS — O039 Complete or unspecified spontaneous abortion without complication: Secondary | ICD-10-CM

## 2020-12-06 NOTE — Progress Notes (Signed)
  History:  Ms. Donna Simon is a 20 y.o. G2P1001 who presents to clinic today for SAB f/u. Pt denies vaginal bleeding, pelvic or abdominal pain. Patient declines contraception today.  The following portions of the patient's history were reviewed and updated as appropriate: allergies, current medications, family history, past medical history, social history, past surgical history and problem list.  Review of Systems:  Review of Systems  Gastrointestinal: Negative for abdominal pain.  Genitourinary:       No vaginal bleeding or pelvic pain.     Objective:  Physical Exam BP 114/68   Pulse 84   Wt 184 lb (83.5 kg)   LMP 10/17/2020   BMI 30.62 kg/m  Physical Exam Vitals reviewed.  Constitutional:      General: She is not in acute distress.    Appearance: Normal appearance. She is not ill-appearing, toxic-appearing or diaphoretic.  HENT:     Head: Normocephalic and atraumatic.  Pulmonary:     Effort: Pulmonary effort is normal.  Neurological:     Mental Status: She is alert and oriented to person, place, and time.  Psychiatric:        Mood and Affect: Mood normal.        Behavior: Behavior normal.        Thought Content: Thought content normal.        Judgment: Judgment normal.    Labs and Imaging No results found for this or any previous visit (from the past 24 hour(s)).  No results found.   Assessment & Plan:  1. Miscarriage -RH positive -hCG today -pt does possible wish to get pregnant in the next year, states she would like her children to be close in age -pt wishes to use condoms for birth control, discussed and declines other methods of contraception, discussed appropriate use of condoms, info given -advised continued, daily use of PNVs   Approximately 5 minutes of total time was spent with this patient on conseling  Hye Trawick, Odie Sera, NP 12/06/2020 2:57 PM

## 2020-12-06 NOTE — Patient Instructions (Signed)
How to Use a Condom Correctly Using a condom correctly and consistently is important for preventing pregnancy and the spread of sexually transmitted diseases (STDs). Condoms work by blocking contact with bodily fluids that can result in pregnancy or spread infection. This is called the barrier method. What do condoms prevent? Condoms can effectively prevent:  Pregnancy.  STDs that are transmitted through genital fluids. These include: ? HIV and AIDS. ? Gonorrhea. ? Chlamydia. ? Hepatitis B and C. Condoms also offer some protection from STDs that are transmitted through skin-to-skin contact if the infected area is covered by the condom. These infections include:  Syphilis.  Genital herpes.  Human papillomavirus (HPV).  Trichomoniasis. What are the different types of condoms? There are both female and female condoms. A female condom is a thin sheath that fits over an erect penis. Female condoms can be made from different materials, including:  Latex.  Polyurethane. This is a type of plastic.  Synthetic rubber.  Lambskin or other natural membranes. These condoms are not as effective at preventing STDs because some germs can pass through them. Female condoms can be lubricated or unlubricated. A female condom is a thin pouch inserted into the vagina. An inner ring holds the condom in place. Another ring covers the outer folds of skin (labia). Female condoms are made from a rubber-like substance (nitrile). Female condoms are lubricated. What are the risks? Generally, using a condom is safe. However, problems may occur, such as:  Allergic reaction to the material that the condom is made from.  A condom tearing when being used. Proper use and storage can help lower this risk. How to use a female condom 1. Make sure the condom is ready to be put on the right way. It should be ready to unroll downward. 2. Place the condom over your erect penis before engaging in any contact with your partner's  mouth, anus, or vagina. 3. Pinch the tip of the condom while rolling it down to the base of the penis so that the entire penis is covered. 4. After you ejaculate, hold the rim of the condom as you withdraw. 5. Carefully pull off the condom away from your partner, and be careful to avoid spills. 6. Wrap the condom in tissue or toilet paper and discard it in a trash can. Do not flush it down the toilet.   How to use a female condom 1. Place the condom inside your vagina before engaging in any contact with your partner's penis. To do this: ? Squeeze the inner ring and insert it into the vagina like a tampon. ? Use your index finger to push it into place. There should be about one inch of condom outside of the vagina to expand during sex. ? Make sure the outer part of the condom completely covers the labia. 2. Have your partner withdraw his penis shortly after ejaculating. 3. Before you stand up, grasp the condom. Twist the outer part slightly to hold in fluid and carefully remove it. Your partner can also grasp the condom and remove it at the same time he withdraws his penis. 4. Wrap the condom in tissue or toilet paper and discard it in a trash can. Do not flush it down the toilet.   General tips and recommendations  Store condoms in a cool, dry place.  Before using a condom: ? Check the package to make sure the expiration date has not passed. ? Make sure that both the package and the condom do not have  any holes, rips, or tears in them. ? Make sure that the condom is not brittle or discolored.  Do not use the same condom more than once. Use a new condom every time you have sex.  Do not use more than one condom at once. This also means that female and female condoms should not be used at the same time.  Do not use oil-based lubricants with condoms. You can use water-based or silicone-based lubricants with female or female condoms. Where to find more information Learn more about how to use a  condom correctly from:  Centers for Disease Control and Prevention: FootballExhibition.com.br  https://[place].com/: PrescriptionShampoo.ca  Planned Parenthood: www.plannedparenthood.org Summary  Using a condom correctly and consistently is important for preventing pregnancy and the spread of sexually transmitted diseases (STDs).  Condoms work by blocking contact with bodily fluids that can result in pregnancy or spread infection.  Store condoms in a cool, dry place. Before using a condom, check the expiration date. Also, make sure the condom has no holes in it and is not brittle or discolored. This information is not intended to replace advice given to you by your health care provider. Make sure you discuss any questions you have with your health care provider. Document Revised: 01/12/2020 Document Reviewed: 01/12/2020 Elsevier Patient Education  2021 [employer].

## 2020-12-07 ENCOUNTER — Ambulatory Visit: Payer: Medicaid Other | Admitting: Physical Therapy

## 2020-12-07 LAB — BETA HCG QUANT (REF LAB): hCG Quant: <1 m[IU]/mL

## 2020-12-20 ENCOUNTER — Ambulatory Visit (INDEPENDENT_AMBULATORY_CARE_PROVIDER_SITE_OTHER): Payer: Medicaid Other

## 2020-12-20 ENCOUNTER — Other Ambulatory Visit: Payer: Self-pay

## 2020-12-20 DIAGNOSIS — Z3200 Encounter for pregnancy test, result unknown: Secondary | ICD-10-CM | POA: Diagnosis not present

## 2020-12-20 NOTE — Progress Notes (Signed)
Donna Simon presents today for UPT. She does not complain of unusual pregnancy concerns at this time.  LMP: 11/21/2020    OBJECTIVE: Appears well, in no apparent distress.  OB History    Gravida  2   Para  1   Term  1   Preterm      AB      Living  1     SAB      IAB      Ectopic      Multiple  0   Live Births  1          Home UPT Result: positive  In-Office UPT result: negative  I have reviewed the patient's medical, obstetrical, social, and family histories, and medications.   ASSESSMENT: Negative pregnancy test. Patient had a miscarriage follow by weekly hcg. Her last HCG check was 12/06/2020 at 5.    PLAN Patient is concern she is pregnant again due to having unprotected intercourse since having her last miscarriage.Follow up with patient after results are reviewed.

## 2020-12-21 ENCOUNTER — Telehealth: Payer: Self-pay

## 2020-12-21 LAB — BETA HCG QUANT (REF LAB): hCG Quant: 29 m[IU]/mL

## 2020-12-21 NOTE — Telephone Encounter (Signed)
TC to patient concerning HCG levels. Advised patient she is likely pregnant again but will need to watch her levels over the next week. Patient has been scheduled for May 25 @ 10:00. Patient voice understanding at this time. Ectopic precaution have been given.

## 2020-12-23 NOTE — Progress Notes (Signed)
Message sent to patient per below, please call to help schedule lab visit:  Good evening Ms. Donna Simon,  Your pregnancy hormone level is elevated. We will need you to come in to the office for a repeat hormone level next week. The office will call you to schedule. Please call our office with questions.  Thank you, Joni Reining

## 2020-12-27 ENCOUNTER — Other Ambulatory Visit: Payer: Self-pay

## 2020-12-27 ENCOUNTER — Other Ambulatory Visit: Payer: Medicaid Other

## 2020-12-27 DIAGNOSIS — Z3200 Encounter for pregnancy test, result unknown: Secondary | ICD-10-CM

## 2020-12-28 LAB — BETA HCG QUANT (REF LAB): hCG Quant: 58 m[IU]/mL

## 2020-12-29 ENCOUNTER — Other Ambulatory Visit: Payer: Self-pay

## 2020-12-29 ENCOUNTER — Other Ambulatory Visit: Payer: Medicaid Other

## 2020-12-29 DIAGNOSIS — Z3200 Encounter for pregnancy test, result unknown: Secondary | ICD-10-CM

## 2020-12-30 LAB — BETA HCG QUANT (REF LAB): hCG Quant: 63 m[IU]/mL

## 2020-12-31 ENCOUNTER — Inpatient Hospital Stay (HOSPITAL_COMMUNITY)
Admission: AD | Admit: 2020-12-31 | Discharge: 2020-12-31 | Disposition: A | Discharge status: Home / Self Care | Payer: Medicaid Other | Attending: Obstetrics and Gynecology | Admitting: Obstetrics and Gynecology

## 2020-12-31 ENCOUNTER — Inpatient Hospital Stay (HOSPITAL_COMMUNITY): Payer: Medicaid Other

## 2020-12-31 ENCOUNTER — Encounter (HOSPITAL_COMMUNITY): Payer: Self-pay | Admitting: Obstetrics and Gynecology

## 2020-12-31 ENCOUNTER — Other Ambulatory Visit: Payer: Self-pay

## 2020-12-31 DIAGNOSIS — O039 Complete or unspecified spontaneous abortion without complication: Secondary | ICD-10-CM | POA: Diagnosis not present

## 2020-12-31 DIAGNOSIS — Z6790 Unspecified blood type, Rh positive: Secondary | ICD-10-CM

## 2020-12-31 DIAGNOSIS — O4690 Antepartum hemorrhage, unspecified, unspecified trimester: Secondary | ICD-10-CM | POA: Diagnosis present

## 2020-12-31 LAB — HCG, QUANTITATIVE, PREGNANCY: hCG, Beta Chain, Quant, S: 28 m[IU]/mL — ABNORMAL HIGH (ref <5)

## 2020-12-31 LAB — COMPREHENSIVE METABOLIC PANEL
ALT: 21 U/L (ref 0–44)
AST: 22 U/L (ref 15–41)
Albumin: 3.4 g/dL — ABNORMAL LOW (ref 3.5–5.0)
Alkaline Phosphatase: 58 U/L (ref 38–126)
Anion gap: 5 (ref 5–15)
BUN: 10 mg/dL (ref 6–20)
CO2: 27 mmol/L (ref 22–32)
Calcium: 9.1 mg/dL (ref 8.9–10.3)
Chloride: 107 mmol/L (ref 98–111)
Creatinine, Ser: 0.74 mg/dL (ref 0.44–1.00)
GFR, Estimated: >60 mL/min (ref >60)
Glucose, Bld: 83 mg/dL (ref 70–99)
Potassium: 4.6 mmol/L (ref 3.5–5.1)
Sodium: 139 mmol/L (ref 135–145)
Total Bilirubin: 0.3 mg/dL (ref 0.3–1.2)
Total Protein: 6.7 g/dL (ref 6.5–8.1)

## 2020-12-31 LAB — CBC
HCT: 35.7 % — ABNORMAL LOW (ref 36.0–46.0)
Hemoglobin: 11.8 g/dL — ABNORMAL LOW (ref 12.0–15.0)
MCH: 29.7 pg (ref 26.0–34.0)
MCHC: 33.1 g/dL (ref 30.0–36.0)
MCV: 89.9 fL (ref 80.0–100.0)
Platelets: 220 10*3/uL (ref 150–400)
RBC: 3.97 MIL/uL (ref 3.87–5.11)
RDW: 14.5 % (ref 11.5–15.5)
WBC: 8.4 10*3/uL (ref 4.0–10.5)
nRBC: 0 % (ref 0.0–0.2)

## 2020-12-31 NOTE — MAU Provider Note (Signed)
History     CSN: [id number]  Arrival date and time: 12/31/20 2050   Event Date/Time   First Provider Initiated Contact with Patient 12/31/20 2117      Chief Complaint  Patient presents with  . Vaginal Bleeding  . Abdominal Pain   20 y.o. [id number] @unknown  gestation presenting with cramping and spotting. Reports onset today. Cramping is mild and worse on right side. Rates pain 3/10. Has not taken anything for it. Spotting started out brown then became pink and red, she has used 3 liners. She had a SAB in April and qhcg followed to negative on 12/06/20. She had +HPT and qhcg of 29 on 12/20/20. Then qhcg of 58 on 12/27/20, and qhcg of 63 on 12/29/20.   OB History    Gravida  3   Para  1   Term  1   Preterm      AB  1   Living  1     SAB  1   IAB      Ectopic      Multiple  0   Live Births  1           History reviewed. No pertinent past medical history.  Past Surgical History:  Procedure Laterality Date  . APPENDECTOMY Right 2009    Family History  Problem Relation Age of Onset  . Hypertension Mother   . Obesity Mother   . Miscarriages / 2010 Mother   . Obesity Father     Social History   Tobacco Use  . Smoking status: Never Smoker  . Smokeless tobacco: Never Used  Vaping Use  . Vaping Use: Never used  Substance Use Topics  . Alcohol use: Not Currently  . Drug use: Never    Allergies: No Known Allergies  Medications Prior to Admission  Medication Sig Dispense Refill Last Dose  . Prenatal Vit-Fe Fumarate-FA (PRENATAL MULTIVITAMIN) TABS tablet Take 1 tablet by mouth daily at 12 noon.   12/31/2020 at Unknown time  . Doxylamine-Pyridoxine (DICLEGIS) 10-10 MG TBEC Take 2 tablets by mouth every 12 (twelve) hours as needed. 60 tablet 0   . promethazine (PHENERGAN) 25 MG tablet Take 1 tablet (25 mg total) by mouth every 6 (six) hours as needed for nausea or vomiting. 30 tablet 0     Review of Systems  Gastrointestinal: Positive for abdominal  pain.  Genitourinary: Positive for vaginal bleeding.   Physical Exam   Blood pressure (!) 122/54, pulse 77, temperature 98.2 F (36.8 C), temperature source Oral, resp. rate 16, height 5\' 5"  (1.651 m), weight 86.1 kg, last menstrual period 10/17/2020, SpO2 99 %, currently breastfeeding.  Physical Exam Vitals and nursing note reviewed.  Constitutional:      General: She is not in acute distress.    Appearance: Normal appearance.  HENT:     Head: Normocephalic and atraumatic.  Cardiovascular:     Rate and Rhythm: Normal rate.  Pulmonary:     Effort: Pulmonary effort is normal. No respiratory distress.  Abdominal:     General: There is no distension.     Palpations: Abdomen is soft. There is no mass.     Tenderness: There is no abdominal tenderness. There is no guarding or rebound.     Hernia: No hernia is present.  Musculoskeletal:        General: Normal range of motion.     Cervical back: Normal range of motion.  Skin:    General: Skin is warm and dry.  Neurological:     General: No focal deficit present.     Mental Status: She is alert and oriented to person, place, and time.  Psychiatric:        Mood and Affect: Mood normal.        Behavior: Behavior normal.    Results for orders placed or performed during the hospital encounter of 12/31/20 (from the past 24 hour(s))  CBC     Status: Abnormal   Collection Time: 12/31/20  9:05 PM  Result Value Ref Range   WBC 8.4 4.0 - 10.5 K/uL   RBC 3.97 3.87 - 5.11 MIL/uL   Hemoglobin 11.8 (L) 12.0 - 15.0 g/dL   HCT 16.1 (L) 09.6 - 04.5 %   MCV 89.9 80.0 - 100.0 fL   MCH 29.7 26.0 - 34.0 pg   MCHC 33.1 30.0 - 36.0 g/dL   RDW 40.9 81.1 - 91.4 %   Platelets 220 150 - 400 K/uL   nRBC 0.0 0.0 - 0.2 %  hCG, quantitative, pregnancy     Status: Abnormal   Collection Time: 12/31/20  9:05 PM  Result Value Ref Range   hCG, Beta Chain, Quant, S 28 (H) <5 mIU/mL  Comprehensive metabolic panel     Status: Abnormal   Collection Time:  12/31/20  9:05 PM  Result Value Ref Range   Sodium 139 135 - 145 mmol/L   Potassium 4.6 3.5 - 5.1 mmol/L   Chloride 107 98 - 111 mmol/L   CO2 27 22 - 32 mmol/L   Glucose, Bld 83 70 - 99 mg/dL   BUN 10 6 - 20 mg/dL   Creatinine, Ser 7.82 0.44 - 1.00 mg/dL   Calcium 9.1 8.9 - 95.6 mg/dL   Total Protein 6.7 6.5 - 8.1 g/dL   Albumin 3.4 (L) 3.5 - 5.0 g/dL   AST 22 15 - 41 U/L   ALT 21 0 - 44 U/L   Alkaline Phosphatase 58 38 - 126 U/L   Total Bilirubin 0.3 0.3 - 1.2 mg/dL   GFR, Estimated >21 >30 mL/min   Anion gap 5 5 - 15   US OB LESS THAN 14 WEEKS WITH OB TRANSVAGINAL  Result Date: 12/31/2020 CLINICAL DATA:  20 year old pregnant female with cramping. Recent spontaneous miscarriage. EXAM: OBSTETRIC <14 WK [place] AND TRANSVAGINAL OB US TECHNIQUE: Both transabdominal and transvaginal ultrasound examinations were performed for complete evaluation of the gestation as well as the maternal uterus, adnexal regions, and pelvic cul-de-sac. Transvaginal technique was performed to assess early pregnancy. COMPARISON:  Pelvic ultrasound dated 11/21/2020. FINDINGS: The uterus is anteverted and appears unremarkable. The endometrium is unremarkable and measures approximately 2 mm in thickness. No intrauterine pregnancy identified. The ovaries are unremarkable. No significant free fluid within the pelvis. IMPRESSION: No intrauterine pregnancy identified and no adnexal masses noted. Findings consistent with pregnancy of unknown location and differential diagnosis include: Recent spontaneous miscarriage, early IUP, or an occult ectopic pregnancy. Correlation with clinical exam and serial HCG levels and follow-up with ultrasound as clinically indicated recommended. Electronically Signed   By: Elgie Collard M.D.   On: 12/31/2020 22:11   MAU Course  Procedures  MDM 5/18 qhcg 29 5/25 qhcg 58 5/27 qhcg 63 Labs and [place] ordered and reviewed. No IUP on [place] and >50% drop in qhcg indicates failed pregnancy. Discussed  findings with pt. Will follow quant until neg. Stable for discharge home.   Assessment and Plan   1. SAB (spontaneous abortion)   2. Vaginal bleeding in  pregnancy   3. Blood type, Rh positive    Discharge home Follow up at [facility] in 1 week- message sent Bleeding return precautions Pelvic rest  Allergies as of 12/31/2020   No Known Allergies     Medication List    TAKE these medications   Doxylamine-Pyridoxine 10-10 MG Tbec Commonly known as: Diclegis Take 2 tablets by mouth every 12 (twelve) hours as needed.   prenatal multivitamin Tabs tablet Take 1 tablet by mouth daily at 12 noon.   promethazine 25 MG tablet Commonly known as: PHENERGAN Take 1 tablet (25 mg total) by mouth every 6 (six) hours as needed for nausea or vomiting.      Donette Larry, CNM 12/31/2020, 10:48 PM

## 2020-12-31 NOTE — MAU Note (Signed)
Pt reports +preg around May 13th, has had HCG levels drawn, she reports she knows they are going up. This morning pt had mild lower abd cramping and some scant VB, not enough pain to rate on a scale she reports. Vitals WNL.

## 2020-12-31 NOTE — Discharge Instructions (Signed)
Miscarriage A miscarriage is the loss of pregnancy before the 20th week. Most miscarriages happen during the first 3 months of pregnancy. Sometimes, a miscarriage can happen before a woman knows that she is pregnant. Having a miscarriage can be an emotional experience. If you have had a miscarriage, talk with your health care provider about any questions you may have about the loss of your baby, the grieving process, and your plans for future pregnancy. What are the causes? Many times, the cause of a miscarriage is not known. What increases the risk? The following factors may make a pregnant woman more likely to have a miscarriage: Certain medical conditions  Conditions that affect the hormone balance in the body, such as thyroid disease or polycystic ovary syndrome.  Diabetes.  Autoimmune disorders.  Infections.  Bleeding disorders.  Obesity. Lifestyle factors  Using products with tobacco or nicotine in them or being exposed to tobacco smoke.  Having alcohol.  Having large amounts of caffeine.  Recreational drug use. Problems with reproductive organs or structures  Cervical insufficiency. This is when the lowest part of the uterus (cervix) opens and thins before pregnancy is at term.  Having a condition called Asherman syndrome. This syndrome causes scarring in the uterus or causes the uterus to be abnormal in structure.  Fibrous growths, called fibroids, in the uterus.  Congenital abnormalities. These problems are present at birth.  Infection of the cervix or uterus. Personal or medical history  Injury (trauma).  Having had a miscarriage before.  Being younger than age 18 or older than age 35.  Exposure to harmful substances in the environment. This may include radiation or heavy metals, such as lead.  Use of certain medicines. What are the signs or symptoms? Symptoms of this condition include:  Vaginal bleeding or spotting, with or without cramps or  pain.  Pain or cramping in the abdomen or lower back.  Fluid or tissue coming out of the vagina. How is this diagnosed? This condition may be diagnosed based on:  A physical exam.  Ultrasound.  Lab tests, such as blood tests, urine tests, or swabs for infection. How is this treated? Treatment for a miscarriage is sometimes not needed if all the pregnancy tissue that was in the uterus comes out on its own, and there are no other problems such as infection or heavy bleeding. In other cases, this condition may be treated with:  Dilation and curettage (D&C). In this procedure, the cervix is stretched open and any remaining pregnancy tissue is removed from the lining of the uterus (endometrium).  Medicines. These may include: ? Antibiotic medicine, to treat infection. ? Medicine to help any remaining pregnancy tissue come out of the body. ? Medicine to reduce (contract) the size of the uterus. These medicines may be given if there is a lot of bleeding. If you have Rh-negative blood, you may be given an injection of a medicine called Rho(D) immune globulin. This medicine helps prevent problems with future pregnancies. Follow these instructions at home: Medicines  Take over-the-counter and prescription medicines only as told by your health care provider.  If you were prescribed antibiotic medicine, take it as told by your health care provider. Do not stop taking the antibiotic even if you start to feel better. Activity  Rest as told by your health care provider. Ask your health care provider what activities are safe for you.  Have someone help with home and family responsibilities during this time. General instructions  Monitor how much tissue   or blood clot material comes out of the vagina.  Do not have sex, douche, or put anything, such as tampons, in your vagina until your health care provider says it is okay.  To help you and your partner with the grieving process, talk with your  health care provider or get counseling.  When you are ready, meet with your health care provider to discuss any important steps you should take for your health. Also, discuss steps you should take to have a healthy pregnancy in the future.  Keep all follow-up visits. This is important.   Where to find more information  The American College of Obstetricians and Gynecologists: acog.org  [place] Department of Health and Human Services Office of Women's Health: hrsa.gov/office-womens-health Contact a health care provider if:  You have a fever or chills.  There is bad-smelling fluid coming from the vagina.  You have more bleeding instead of less.  Tissue or blood clots come out of your vagina. Get help right away if:  You have severe cramps or pain in your back or abdomen.  Heavy bleeding soaks through 2 large sanitary pads an hour for more than 2 hours.  You become light-headed or weak.  You faint.  You feel sad, and your sadness takes over your thoughts.  You think about hurting yourself. If you ever feel like you may hurt yourself or others, or have thoughts about taking your own life, get help right away. Go to your nearest emergency department or:  Call your local emergency services (911 in the [place]  Call a suicide crisis helpline, such as the National Suicide Prevention Lifeline at 1-800-273-8255. This is open 24 hours a day in the [place]  Text the Crisis Text Line at 741741 (in the [place] Summary  Most miscarriages happen in the first 3 months of pregnancy. Sometimes miscarriage happens before a woman knows that she is pregnant.  Follow instructions from your health care provider about medicines and activity.  To help you and your partner with grieving, talk with your health care provider or get counseling.  Keep all follow-up visits. This information is not intended to replace advice given to you by your health care provider. Make sure you discuss any questions you  have with your health care provider. Document Revised: 01/21/2020 Document Reviewed: 01/21/2020 Elsevier Patient Education  2021 Elsevier Inc.  

## 2021-01-02 ENCOUNTER — Encounter: Payer: Medicaid Other | Admitting: Obstetrics and Gynecology

## 2021-01-08 ENCOUNTER — Other Ambulatory Visit: Payer: Self-pay

## 2021-01-08 ENCOUNTER — Other Ambulatory Visit: Payer: Medicaid Other

## 2021-01-08 DIAGNOSIS — Z3200 Encounter for pregnancy test, result unknown: Secondary | ICD-10-CM

## 2021-01-09 LAB — BETA HCG QUANT (REF LAB): hCG Quant: <1 m[IU]/mL

## 2021-04-17 ENCOUNTER — Other Ambulatory Visit: Payer: Self-pay

## 2021-04-17 ENCOUNTER — Ambulatory Visit (INDEPENDENT_AMBULATORY_CARE_PROVIDER_SITE_OTHER): Payer: Medicaid Other | Admitting: Obstetrics & Gynecology

## 2021-04-17 ENCOUNTER — Encounter: Payer: Self-pay | Admitting: Obstetrics & Gynecology

## 2021-04-17 VITALS — BP 106/78 | HR 78 | Ht 65.0 in | Wt 170.2 lb

## 2021-04-17 DIAGNOSIS — N881 Old laceration of cervix uteri: Secondary | ICD-10-CM

## 2021-04-17 NOTE — Progress Notes (Signed)
Pt states she noticed a bump on her cervix today and wanted to have it checked. States she does not know how long it has been there. No increased d/c or pain associated with it.

## 2021-04-17 NOTE — Progress Notes (Signed)
Patient ID: Donna Simon, female   DOB: 2001-01-10, 20 y.o.   MRN: [id number]  Chief Complaint  Patient presents with   Other    Cervical Lesion    HPI Donna Simon is a 20 y.o. female.  [id number] Patient's last menstrual period was 03/31/2021 (exact date). She is self monitoring for conception and noticed a palpable lump on her cervix on the left, no pain or discharge HPI  No past medical history on file.  Past Surgical History:  Procedure Laterality Date   APPENDECTOMY Right 2009    Family History  Problem Relation Age of Onset   Hypertension Mother    Obesity Mother    Miscarriages / [place] Mother    Obesity Father     Social History Social History   Tobacco Use   Smoking status: Never   Smokeless tobacco: Never  Vaping Use   Vaping Use: Never used  Substance Use Topics   Alcohol use: Not Currently   Drug use: Never    No Known Allergies  Current Outpatient Medications  Medication Sig Dispense Refill   Prenatal Vit-Fe Fumarate-FA (PRENATAL MULTIVITAMIN) TABS tablet Take 1 tablet by mouth daily at 12 noon.     No current facility-administered medications for this visit.    Review of Systems Review of Systems  Constitutional: Negative.   Respiratory: Negative.    Genitourinary:  Negative for dyspareunia, menstrual problem, pelvic pain and vaginal discharge.   Blood pressure 106/78, pulse 78, height 5\' 5"  (1.651 m), weight 170 lb 3.2 oz (77.2 kg), last menstrual period 03/31/2021, unknown if currently breastfeeding.  Physical Exam Physical Exam Vitals and nursing note reviewed. Exam conducted with a chaperone present.  Constitutional:      Appearance: Normal appearance.  Pulmonary:     Effort: Pulmonary effort is normal.  Genitourinary:    General: Normal vulva.     Exam position: Lithotomy position.     Vagina: Normal.     Cervix: Normal.        Comments: No visible lesion but firm nodule 5 mm at the os likely scar due to  parity Skin:    General: Skin is warm and dry.  Neurological:     Mental Status: She is alert.    Data Reviewed Pap 10/2020 nl  Assessment Palpable cervical scar tissue benign  Plan Reassurance was given. She wants to conceive and she will notify 11/2020 if she has a positive home test    [place] 04/17/2021, 4:36 PM

## 2021-04-26 ENCOUNTER — Other Ambulatory Visit: Payer: Medicaid Other

## 2021-04-26 ENCOUNTER — Other Ambulatory Visit: Payer: Self-pay | Admitting: Obstetrics & Gynecology

## 2021-04-26 ENCOUNTER — Other Ambulatory Visit: Payer: Self-pay

## 2021-04-26 DIAGNOSIS — Z3200 Encounter for pregnancy test, result unknown: Secondary | ICD-10-CM

## 2021-04-27 LAB — BETA HCG QUANT (REF LAB): hCG Quant: 42 m[IU]/mL

## 2021-04-30 ENCOUNTER — Other Ambulatory Visit: Payer: Self-pay | Admitting: *Deleted

## 2021-04-30 ENCOUNTER — Telehealth: Payer: Self-pay | Admitting: *Deleted

## 2021-04-30 ENCOUNTER — Other Ambulatory Visit: Payer: Medicaid Other

## 2021-04-30 ENCOUNTER — Other Ambulatory Visit: Payer: Self-pay

## 2021-04-30 DIAGNOSIS — Z3200 Encounter for pregnancy test, result unknown: Secondary | ICD-10-CM

## 2021-04-30 NOTE — Telephone Encounter (Signed)
Returned TC to patient regarding beta HCG results. Per Dr. Debroah Loop patient should come in for repeat beta HCG. Patient informed and transferred to front office to schedule lab visit. Order placed.

## 2021-05-01 LAB — BETA HCG QUANT (REF LAB): hCG Quant: 167 m[IU]/mL

## 2021-05-02 NOTE — Progress Notes (Signed)
TC to patient to notify of Dr. Olivia Mackie recommendation for repeat beta HCG in one week. Call transferred to front office for patient to schedule nurse visit for 05/07/21.

## 2021-05-07 ENCOUNTER — Ambulatory Visit: Payer: Medicaid Other | Admitting: Obstetrics & Gynecology

## 2021-05-07 ENCOUNTER — Other Ambulatory Visit: Payer: Self-pay

## 2021-05-07 ENCOUNTER — Ambulatory Visit: Payer: Medicaid Other

## 2021-05-07 DIAGNOSIS — Z3200 Encounter for pregnancy test, result unknown: Secondary | ICD-10-CM

## 2021-05-08 ENCOUNTER — Telehealth: Payer: Self-pay

## 2021-05-08 LAB — BETA HCG QUANT (REF LAB): hCG Quant: 351 m[IU]/mL

## 2021-05-08 NOTE — Telephone Encounter (Signed)
Pt called with questions about her BHCG levels. Wanted to know if they were rising appropriately. Advised yes. Pt was concerned they were not rising correctly. Per Dr. Donavan Foil, levels are rising appropriately and pt should return to office in 3 weeks for repeat BHCG and viability scan. Pt was given ectopic precautions. Pt agreed with plan and verbalized understanding. Appointment made.

## 2021-05-09 ENCOUNTER — Inpatient Hospital Stay (HOSPITAL_COMMUNITY)
Admission: AD | Admit: 2021-05-09 | Discharge: 2021-05-09 | Disposition: A | Discharge status: Home / Self Care | Payer: Medicaid Other | Attending: Obstetrics and Gynecology | Admitting: Obstetrics and Gynecology

## 2021-05-09 ENCOUNTER — Other Ambulatory Visit: Payer: Self-pay

## 2021-05-09 ENCOUNTER — Telehealth: Payer: Self-pay

## 2021-05-09 ENCOUNTER — Encounter (HOSPITAL_COMMUNITY): Payer: Self-pay | Admitting: Obstetrics and Gynecology

## 2021-05-09 ENCOUNTER — Inpatient Hospital Stay (HOSPITAL_COMMUNITY): Payer: Medicaid Other

## 2021-05-09 DIAGNOSIS — R1030 Lower abdominal pain, unspecified: Secondary | ICD-10-CM | POA: Diagnosis present

## 2021-05-09 DIAGNOSIS — O3680X0 Pregnancy with inconclusive fetal viability, not applicable or unspecified: Secondary | ICD-10-CM

## 2021-05-09 DIAGNOSIS — O209 Hemorrhage in early pregnancy, unspecified: Secondary | ICD-10-CM | POA: Insufficient documentation

## 2021-05-09 DIAGNOSIS — Z3A01 Less than 8 weeks gestation of pregnancy: Secondary | ICD-10-CM | POA: Insufficient documentation

## 2021-05-09 LAB — URINALYSIS, ROUTINE W REFLEX MICROSCOPIC
Bilirubin Urine: NEGATIVE
Glucose, UA: NEGATIVE mg/dL
Hgb urine dipstick: NEGATIVE
Ketones, ur: NEGATIVE mg/dL
Leukocytes,Ua: NEGATIVE
Nitrite: NEGATIVE
Protein, ur: NEGATIVE mg/dL
Specific Gravity, Urine: 1.017 (ref 1.005–1.030)
pH: 6 (ref 5.0–8.0)

## 2021-05-09 LAB — HCG, QUANTITATIVE, PREGNANCY: hCG, Beta Chain, Quant, S: 476 m[IU]/mL — ABNORMAL HIGH (ref <5)

## 2021-05-09 NOTE — Telephone Encounter (Signed)
S/w patient and advised of provider recommendations for lab follow up and u/s appt, pt voiced understanding.

## 2021-05-09 NOTE — MAU Provider Note (Addendum)
Chief Complaint: Possible Pregnancy and Abdominal Pain   Event Date/Time   First Provider Initiated Contact with Patient 05/09/21 2043        SUBJECTIVE HPI: Donna Simon is a 20 y.o. G4P1021 at [redacted]w[redacted]d by LMP who presents to maternity admissions reporting low abdominal cramping and spotting for 1 week.  Has been followed in the office with inconsistent rise in HCG levels   This is an unplanned but highly desired pregnancy. . She denies urinary symptoms, h/a, dizziness, n/v, or fever/chills.   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  .(SVD January 2022, SABs in 4/22 and 12/2020  Abdominal Pain This is a recurrent problem. The current episode started in the past 7 days. The onset quality is gradual. The problem occurs intermittently. The problem is unchanged. The quality of the pain is described as cramping. Pertinent negatives include no constipation, diarrhea, dysuria or fever. Nothing relieves the symptoms. Past treatments include nothing.   RN Note: Pt reports she found out she was preg on 09/22. Lower abd cramping x one week, some spotting. LMP 03/31/2021  History reviewed. No pertinent past medical history. Past Surgical History:  Procedure Laterality Date   APPENDECTOMY Right 2009   Social History   Socioeconomic History   Marital status: Single    Spouse name: Not on file   Number of children: Not on file   Years of education: Not on file   Highest education level: Not on file  Occupational History   Not on file  Tobacco Use   Smoking status: Never   Smokeless tobacco: Never  Vaping Use   Vaping Use: Never used  Substance and Sexual Activity   Alcohol use: Not Currently   Drug use: Never   Sexual activity: Yes  Other Topics Concern   Not on file  Social History Narrative   Not on file   Social Determinants of Health   Financial Resource Strain: Not on file  Food Insecurity: Not on file  Transportation Needs: Not on file  Physical Activity: Not on  file  Stress: Not on file  Social Connections: Not on file  Intimate Partner Violence: Not on file   No current facility-administered medications on file prior to encounter.   Current Outpatient Medications on File Prior to Encounter  Medication Sig Dispense Refill   Prenatal Vit-Fe Fumarate-FA (PRENATAL MULTIVITAMIN) TABS tablet Take 1 tablet by mouth daily at 12 noon.     No Known Allergies  I have reviewed patient's Past Medical Hx, Surgical Hx, Family Hx, Social Hx, medications and allergies.   ROS:  Review of Systems  Constitutional:  Negative for fever.  Gastrointestinal:  Positive for abdominal pain. Negative for constipation and diarrhea.  Genitourinary:  Negative for dysuria.  Review of Systems  Other systems negative   Physical Exam  Physical Exam Patient Vitals for the past 24 hrs:  BP Temp Temp src Pulse Resp SpO2 Height Weight  05/09/21 1715 (!) 118/59 98 F (36.7 C) Oral 75 15 100 % 5\' 5"  (1.651 m) 78.9 kg   Constitutional: Well-developed, well-nourished female in no acute distress.  Cardiovascular: normal rate Respiratory: normal effort GI: Abd soft, non-tender.  MS: Extremities nontender, no edema, normal ROM Neurologic: Alert and oriented x 4.  GU: Neg CVAT.  PELVIC EXAM: l Bimanual exam: Cervix 0/long/high, firm, anterior, neg CMT, uterus nontender, nonenlarged, adnexa without tenderness, enlargement, or mass   LAB RESULTS Results for orders placed or performed during the hospital encounter  of 05/09/21 (from the past 24 hour(s))  Urinalysis, Routine w reflex microscopic Urine, Clean Catch     Status: None   Collection Time: 05/09/21  5:18 PM  Result Value Ref Range   Color, Urine YELLOW YELLOW   APPearance CLEAR CLEAR   Specific Gravity, Urine 1.017 1.005 - 1.030   pH 6.0 5.0 - 8.0   Glucose, UA NEGATIVE NEGATIVE mg/dL   Hgb urine dipstick NEGATIVE NEGATIVE   Bilirubin Urine NEGATIVE NEGATIVE   Ketones, ur NEGATIVE NEGATIVE mg/dL   Protein, ur  NEGATIVE NEGATIVE mg/dL   Nitrite NEGATIVE NEGATIVE   Leukocytes,Ua NEGATIVE NEGATIVE  hCG, quantitative, pregnancy     Status: Abnormal   Collection Time: 05/09/21  5:38 PM  Result Value Ref Range   hCG, Beta Chain, Quant, S 476 (H) <5 mIU/mL    --/--/A POS (01/28 0245)  IMAGING US OB LESS THAN 14 WEEKS WITH OB TRANSVAGINAL  Result Date: 05/09/2021 CLINICAL DATA:  Cramps x1 week with spotting. EXAM: OBSTETRIC <14 WK [place] AND TRANSVAGINAL OB US TECHNIQUE: Both transabdominal and transvaginal ultrasound examinations were performed for complete evaluation of the gestation as well as the maternal uterus, adnexal regions, and pelvic cul-de-sac. Transvaginal technique was performed to assess early pregnancy. COMPARISON:  Dec 31, 2020 FINDINGS: Intrauterine gestational sac: None Yolk sac:  Not Visualized. Embryo:  Not Visualized. Cardiac Activity: Not Visualized. Heart Rate: N/A  bpm Subchorionic hemorrhage:  None visualized. Maternal uterus/adnexae: The endometrium measures 11 mm in thickness and is normal in appearance. The bilateral ovaries are visualized and are normal in appearance. No pelvic free fluid is seen. IMPRESSION: No evidence of an intrauterine pregnancy. Electronically Signed   By: Aram Candela M.D.   On: 05/09/2021 21:35     MAU Management/MDM: Ordered repeat HCG which rose but not quite 66%. Will check baseline Ultrasound to rule out ectopic.>> this was negative Discussed we cannot rule out ectopic pregnancy until we see an IUP.  With the inappropriate rise in HCG, it is possible that this may be a failed pregnancy  Patient wants to monitor one more time in hopes it will improve  This bleeding/pain can represent a normal pregnancy with bleeding, spontaneous abortion or even an ectopic which can be life-threatening.  The process as listed above helps to determine which of these is present.    ASSESSMENT 1. Bleeding in early pregnancy   2. Pregnancy of unknown anatomic  location   3.    Closely spaced pregnancies with 2 SABs shortly after her full term birth  PLAN Discharge home Plan to repeat HCG level in office Monday or Tuesday.  Will repeat  Ultrasound in about 7-10 days if HCG levels double appropriately  Ectopic precautions  Discussed if she miscarries again, she may want to pursue evaluation for factors contributing.   Pt stable at time of discharge. Encouraged to return here if she develops worsening of symptoms, increase in pain, fever, or other concerning symptoms.    Wynelle Bourgeois CNM, MSN Certified Nurse-Midwife 05/09/2021  8:44 PM

## 2021-05-09 NOTE — MAU Note (Signed)
Pt reports she found out she was preg on 09/22. Lower abd cramping x one week, some spotting. LMP 03/31/2021

## 2021-05-14 ENCOUNTER — Other Ambulatory Visit: Payer: Medicaid Other

## 2021-05-14 ENCOUNTER — Other Ambulatory Visit: Payer: Self-pay

## 2021-05-14 DIAGNOSIS — O209 Hemorrhage in early pregnancy, unspecified: Secondary | ICD-10-CM

## 2021-05-15 ENCOUNTER — Other Ambulatory Visit: Payer: Medicaid Other

## 2021-05-15 LAB — BETA HCG QUANT (REF LAB): hCG Quant: 34 m[IU]/mL

## 2021-05-29 ENCOUNTER — Other Ambulatory Visit: Payer: Medicaid Other

## 2021-05-31 ENCOUNTER — Other Ambulatory Visit: Payer: Medicaid Other

## 2021-05-31 ENCOUNTER — Ambulatory Visit (INDEPENDENT_AMBULATORY_CARE_PROVIDER_SITE_OTHER): Payer: Medicaid Other | Admitting: Obstetrics and Gynecology

## 2021-05-31 ENCOUNTER — Encounter: Payer: Self-pay | Admitting: Obstetrics and Gynecology

## 2021-05-31 ENCOUNTER — Other Ambulatory Visit: Payer: Self-pay

## 2021-05-31 VITALS — BP 121/74 | HR 85 | Wt 173.0 lb

## 2021-05-31 DIAGNOSIS — N96 Recurrent pregnancy loss: Secondary | ICD-10-CM | POA: Diagnosis not present

## 2021-05-31 DIAGNOSIS — Z23 Encounter for immunization: Secondary | ICD-10-CM | POA: Diagnosis not present

## 2021-05-31 DIAGNOSIS — O039 Complete or unspecified spontaneous abortion without complication: Secondary | ICD-10-CM

## 2021-05-31 NOTE — Progress Notes (Signed)
20 yo P1021 diagnosed with a spontaneous miscarriage on 05/14/21 presenting today for follow up. Patient reports  History reviewed. No pertinent past medical history. Past Surgical History:  Procedure Laterality Date   APPENDECTOMY Right 2009   Family History  Problem Relation Age of Onset   Hypertension Mother    Obesity Mother    Miscarriages / [place] Mother    Obesity Father    Social History   Tobacco Use   Smoking status: Never   Smokeless tobacco: Never  Vaping Use   Vaping Use: Never used  Substance Use Topics   Alcohol use: Not Currently   Drug use: Never   ROS See pertinent in HPI. All other systems reviewed and non contributory  Blood pressure 121/74, pulse 85, weight 173 lb (78.5 kg), last menstrual period 03/31/2021, unknown if currently breastfeeding. GENERAL: Well-developed, well-nourished female in no acute distress.  NEURO: alert and oriented x 3  A/P 20 yo with recent spontaneous miscarriage - Quant HCG today - Patient is not interested in contraception - Advised to take prenatal vitamins - Patient has had 3 miscarriages in the past year- A1C and TSH today - Patient referred to genetic counseling - Normal Pap 10/2020 - Flu vaccine today

## 2021-05-31 NOTE — Progress Notes (Signed)
Pt presents for f/u after SAB. Pt does not desire BC at this time.  Flu vaccine given LD without difficulty.

## 2021-06-01 LAB — HEMOGLOBIN A1C
Est. average glucose Bld gHb Est-mCnc: 105 mg/dL
Hgb A1c MFr Bld: 5.3 % (ref 4.8–5.6)

## 2021-06-01 LAB — TSH: TSH: 0.6 u[IU]/mL (ref 0.450–4.500)

## 2021-06-01 LAB — BETA HCG QUANT (REF LAB): hCG Quant: <1 m[IU]/mL

## 2021-06-12 ENCOUNTER — Other Ambulatory Visit: Payer: Self-pay

## 2021-06-12 ENCOUNTER — Ambulatory Visit (INDEPENDENT_AMBULATORY_CARE_PROVIDER_SITE_OTHER): Payer: Medicaid Other

## 2021-06-12 VITALS — BP 99/62 | HR 78 | Ht 65.0 in | Wt 174.0 lb

## 2021-06-12 DIAGNOSIS — N912 Amenorrhea, unspecified: Secondary | ICD-10-CM

## 2021-06-12 DIAGNOSIS — N96 Recurrent pregnancy loss: Secondary | ICD-10-CM

## 2021-06-12 LAB — POCT URINE PREGNANCY: Preg Test, Ur: POSITIVE — AB

## 2021-06-12 NOTE — Progress Notes (Signed)
Donna Simon presents today for UPT. She has no unusual complaints.  LMP: unknown     OBJECTIVE: Appears well, in no apparent distress.  OB History     Gravida  4   Para  1   Term  1   Preterm      AB  3   Living  1      SAB  2   IAB      Ectopic      Multiple  0   Live Births  1          Home UPT Result: 06/11/21 Positive In-Office UPT result: 06/12/21 Faint Positive I have reviewed the patient's medical, obstetrical, social, and family histories, and medications.   ASSESSMENT: Faint Positive pregnancy test  PLAN Quant HCG today and follow up with genetic counseling. Will follow up once recommendations have been made by them.

## 2021-06-13 LAB — BETA HCG QUANT (REF LAB): hCG Quant: 32 m[IU]/mL

## 2021-06-14 ENCOUNTER — Other Ambulatory Visit: Payer: Medicaid Other

## 2021-06-14 ENCOUNTER — Other Ambulatory Visit: Payer: Self-pay | Admitting: *Deleted

## 2021-06-14 ENCOUNTER — Other Ambulatory Visit: Payer: Self-pay

## 2021-06-14 ENCOUNTER — Ambulatory Visit: Payer: Medicaid Other | Attending: Obstetrics & Gynecology

## 2021-06-14 DIAGNOSIS — Z3200 Encounter for pregnancy test, result unknown: Secondary | ICD-10-CM

## 2021-06-14 DIAGNOSIS — O2620 Pregnancy care for patient with recurrent pregnancy loss, unspecified trimester: Secondary | ICD-10-CM | POA: Diagnosis not present

## 2021-06-14 NOTE — Progress Notes (Signed)
Name: Donna Simon Indication: Recurrent Pregnancy Loss (RPL)  DOB: [birth year] Age: 20 y.o.   EDC: 02/23/2022 LMP: 05/11/2021 Referring Provider:  Catalina Antigua, MD  EGA: [redacted]w[redacted]d Genetic Counselor: Donna Dunk, MS, CGC  OB Hx: [id number] Date of Appointment: 06/14/2021  Accompanied by: Donna Simon Face to Face Time: 45 Minutes   Previous Testing Completed: Donna Simon previously completed Expanded Horizon Carrier Screening (scanned into Epic under the Media tab). She screened to not be a carrier for several autosomal recessive and x-linked conditons. A negative result on carrier screening reduces the likelihood of being a carrier, however, does not entirely rule out the possibility.   Medical History:  This is Donna Simon's 5th pregnancy with her partner Donna Simon). She has one living, healthy son. She has had 3 early losses. Reports she takes prenatal vitamins. Denies personal history of diabetes, high blood pressure, thyroid conditions, and seizures. Denies bleeding, infections, and fevers in this pregnancy. Denies using tobacco, alcohol, or street drugs in this pregnancy.   Family History: A pedigree was created and scanned into Epic under the Media tab. Donna Simon reports Donna Simon' 53 year old nephew has a small leg length discrepancy and he may have developmental delays. No other information is available about this individual. Maternal ethnicity reported as Hispanic and paternal ethnicity reported as Hispanic. Denies Ashkenazi Jewish ancestry. Family history not remarkable for consanguinity, intellectual disability, autism spectrum disorder, still births, or unexplained neonatal death.     Genetic Counseling:   Recurrent Pregnancy Loss. Recurrent Pregnancy Loss (RPL) is defined as three or more clinically recognized consecutive or non-consecutive pregnancy losses occurring prior to fetal viability (<[redacted] weeks gestation). Pregnancy losses occur for many reasons, including endocrine factors  (i.e. uncontrolled diabetes mellitus, luteal phase deficiency, or polycystic ovarian syndrome), environmental agents, immunologic causes (i.e. antiphospholipid syndrome), maternal factors (i.e. uterine anatomic malformations, cervical abnormalities), as well as chromosomal and single-gene disorders. The overall rate of pregnancy loss is approximately 20%, with the majority occurring in the first trimester. Chromosomal causes account for 50-70% of first trimester losses but are less commonly cited as a cause for late gestational pregnancy loss. Most chromosome abnormalities are a result of a new change in the sperm or egg that conceived the fetus; however, in 3-5% of all couples with RPL one member of the couple is found to have a balanced chromosome rearrangement, called a translocation. A translocation occurs when there is an exchange in chromosome material, such as when a segment of one chromosome breaks off and reattaches to a different chromosome. When the translocation is balanced, there is no extra or missing genetic material; however, an individual who carries a balanced translocation has an increased risk to pass unbalanced chromosomes to their offspring. In couples in which one partner is a carrier of a balanced translocation, this can result in an increased risk for infertility, RPL, and offspring with abnormal chromosomes. Due to this couple's history of RPL, maternal and paternal chromosome analysis was offered. This testing may help to inform the cause of the previous pregnancy losses, and may help the couple make informed reproductive decisions moving forward.    Patient Plan:  Proceed with: Donna Simon was provided the ICD-10 codes and CPT codes for the maternal and paternal chromosome analysis testing. She is going to call her insurance to find out what her out of pocket cost is estimated to be. She states she will call Genetic Counseling at [facility] if she decides to pursue  this testing.  All questions were  answered.   Thank you for sharing in the care of Donna Simon with [place].  Please do not hesitate to contact us if you have any questions.  Donna Dunk, MS, [facility]

## 2021-06-14 NOTE — Progress Notes (Signed)
TC from patient requesting bHCG per recommendation of genetic counselor. 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 bHCG ordered per Dr. Clearance Coots. Patient will come in now.

## 2021-06-15 LAB — BETA HCG QUANT (REF LAB): hCG Quant: 138 m[IU]/mL

## 2021-06-18 ENCOUNTER — Other Ambulatory Visit: Payer: Self-pay | Admitting: Obstetrics and Gynecology

## 2021-06-18 ENCOUNTER — Telehealth: Payer: Self-pay

## 2021-06-18 MED ORDER — PROGESTERONE 200 MG PO CAPS: ORAL_CAPSULE | ORAL | 3 refills | Status: DC

## 2021-06-18 NOTE — Telephone Encounter (Signed)
Pt notified that Dr. Jolayne Panther sent in progesterone. Advised patient will need appointment in approximately 4 weeks based off ovulation dates. Advised her to call insurance to follow up on genetic counseling. Pt agreed and verbalized understanding.

## 2021-06-18 NOTE — Telephone Encounter (Signed)
Pt called to f/u about her HCG levels. Most recent level drawn on 11/10 was 138 and patient wanted to know how to proceed. LMP: unknown. Pt had recent miscarriage. She was seen by genetic counselors for recurrent MABs. Please advise.

## 2021-06-22 ENCOUNTER — Encounter: Payer: Self-pay | Admitting: Obstetrics and Gynecology

## 2021-07-16 ENCOUNTER — Telehealth: Payer: Self-pay

## 2021-07-16 ENCOUNTER — Other Ambulatory Visit: Payer: Self-pay

## 2021-07-16 ENCOUNTER — Ambulatory Visit (INDEPENDENT_AMBULATORY_CARE_PROVIDER_SITE_OTHER): Payer: Medicaid Other

## 2021-07-16 VITALS — BP 119/69 | HR 76 | Ht 65.0 in | Wt 178.2 lb

## 2021-07-16 DIAGNOSIS — O3680X0 Pregnancy with inconclusive fetal viability, not applicable or unspecified: Secondary | ICD-10-CM

## 2021-07-16 DIAGNOSIS — Z3481 Encounter for supervision of other normal pregnancy, first trimester: Secondary | ICD-10-CM | POA: Diagnosis not present

## 2021-07-16 DIAGNOSIS — Z3A08 8 weeks gestation of pregnancy: Secondary | ICD-10-CM | POA: Diagnosis not present

## 2021-07-16 DIAGNOSIS — Z3490 Encounter for supervision of normal pregnancy, unspecified, unspecified trimester: Secondary | ICD-10-CM | POA: Insufficient documentation

## 2021-07-16 NOTE — Progress Notes (Signed)
Patient was assessed and managed by nursing staff during this encounter. I have reviewed the chart and agree with the documentation and plan. I have also made any necessary editorial changes.  Catalina Antigua, MD 07/16/2021 9:24 AM

## 2021-07-16 NOTE — Telephone Encounter (Signed)
Pt called to get heart rate from u/s this morning. Advised 170 per u/s. Pt verbalized understanding.

## 2021-07-16 NOTE — Progress Notes (Signed)
New OB Intake  I connected with  Darryl Lent on 07/16/21 at  8:15 AM EST by in person and verified that I am speaking with the correct person using two identifiers. Nurse is located at [facility] and pt is located at [facility].  I discussed the limitations, risks, security and privacy concerns of performing an evaluation and management service by telephone and the availability of in person appointments. I also discussed with the patient that there may be a patient responsible charge related to this service. The patient expressed understanding and agreed to proceed.  I explained I am completing New OB Intake today. We discussed her EDD of 02/22/22 that is based on early u/s. Pt is G5/P1031. I reviewed her allergies, medications, Medical/Surgical/OB history, and appropriate screenings. I informed her of [facility] services. Based on history, this is a/an  pregnancy uncomplicated .   Patient Active Problem List   Diagnosis Date Noted   Supervision of normal pregnancy 07/16/2021    Concerns addressed today  Delivery Plans:  Plans to deliver at [facility].   MyChart/Babyscripts MyChart access verified. I explained pt will have some visits in office and some virtually. Babyscripts instructions given and order placed. Patient verifies receipt of registration text/e-mail. Account successfully created and app downloaded.  Blood Pressure Cuff  Patient has one from her last pregnancy. Explained after first prenatal appt pt will check weekly and document in Babyscripts.  Weight scale: Patient has weight scale from last pregnancy.   Anatomy US Explained first scheduled [place] will be around 19 weeks. Dating and viability scan performed today. Labs Discussed Avelina Laine genetic screening with patient. Would like both Panorama and Horizon drawn at new OB visit. Routine prenatal labs needed.  Covid Vaccine Patient has covid vaccine.     Informed patient of [facility] website  and placed link in her AVS.    Social Determinants of Health Food Insecurity: Patient denies food insecurity. WIC Referral: Patient is interested in referral to [facility].  Transportation: Patient denies transportation needs. Childcare: Discussed no children allowed at ultrasound appointments. Offered childcare services; patient declines childcare services at this time.  Send link to Pregnancy Navigators   Placed OB Box on problem list and updated  First visit review I reviewed new OB appt with pt. I explained she will have a pelvic exam, ob bloodwork with genetic screening, and PAP smear. Explained pt will be seen by Sharen Counter at first visit; encounter routed to appropriate provider. Explained that patient will be seen by pregnancy navigator following visit with provider. [facility] information placed in AVS.   Hamilton Capri, RN 07/16/2021  9:05 AM

## 2021-07-17 ENCOUNTER — Ambulatory Visit: Payer: Self-pay

## 2021-08-05 NOTE — L&D Delivery Note (Signed)
Delivery Note  About an hour or so after AROM, pt felt increased pressure w/an urge to push. After about a 20 minute 2nd stage, at 12:22 AM a viable female was delivered via Vaginal, Spontaneous (Presentation: Right Occiput Anterior).  The delivery was a beautiful waterbirth, w/mom in a supine position, catching her baby herself and was assisted in promptly lifting her out of the water to her chest. APGAR: 8, 9; weight pending., Several minutes after, she had a gush of blood, so cord was doubly clamped and cut.  Baby given to dad, mom assisted out of tub.  Placenta status: Spontaneous, Intact.  Cord: 3 vessels with the following complications: None. Paragard inserted (see separate note).    Anesthesia: None Episiotomy: None Lacerations: None Suture Repair:  Est. Blood Loss (mL): 147  Mom to postpartum.  Baby to Couplet care / Skin to Skin.  Donna Simon 02/15/2022, 1:27 AM

## 2021-08-07 NOTE — Progress Notes (Signed)
See RN note.

## 2021-08-13 ENCOUNTER — Ambulatory Visit (INDEPENDENT_AMBULATORY_CARE_PROVIDER_SITE_OTHER): Payer: Medicaid Other | Admitting: Licensed Clinical Social Worker

## 2021-08-13 ENCOUNTER — Other Ambulatory Visit: Payer: Self-pay

## 2021-08-13 ENCOUNTER — Other Ambulatory Visit (HOSPITAL_COMMUNITY)
Admission: RE | Admit: 2021-08-13 | Discharge: 2021-08-13 | Disposition: A | Discharge status: Home / Self Care | Payer: Medicaid Other | Source: Ambulatory Visit | Attending: Advanced Practice Midwife | Admitting: Advanced Practice Midwife

## 2021-08-13 ENCOUNTER — Encounter: Payer: Self-pay | Admitting: Advanced Practice Midwife

## 2021-08-13 ENCOUNTER — Ambulatory Visit (INDEPENDENT_AMBULATORY_CARE_PROVIDER_SITE_OTHER): Payer: Medicaid Other | Admitting: Advanced Practice Midwife

## 2021-08-13 VITALS — BP 120/68 | HR 73 | Wt 177.0 lb

## 2021-08-13 DIAGNOSIS — Z3481 Encounter for supervision of other normal pregnancy, first trimester: Secondary | ICD-10-CM | POA: Insufficient documentation

## 2021-08-13 DIAGNOSIS — F439 Reaction to severe stress, unspecified: Secondary | ICD-10-CM | POA: Diagnosis not present

## 2021-08-13 DIAGNOSIS — O280 Abnormal hematological finding on antenatal screening of mother: Secondary | ICD-10-CM

## 2021-08-13 DIAGNOSIS — N96 Recurrent pregnancy loss: Secondary | ICD-10-CM

## 2021-08-13 DIAGNOSIS — Z3A12 12 weeks gestation of pregnancy: Secondary | ICD-10-CM

## 2021-08-13 DIAGNOSIS — Z3A15 15 weeks gestation of pregnancy: Secondary | ICD-10-CM

## 2021-08-13 DIAGNOSIS — Z3A19 19 weeks gestation of pregnancy: Secondary | ICD-10-CM

## 2021-08-13 NOTE — Progress Notes (Signed)
Pt states she is using progesterone occasionally, not every day.

## 2021-08-13 NOTE — Progress Notes (Signed)
Subjective:   Donna Simon is a 21 y.o. [id number] at [redacted]w[redacted]d by early ultrasound being seen today for her first obstetrical visit.  Her obstetrical history is significant for  NSVD waterbirth then 3 miscarriages prior to this pregnancy  and has Supervision of normal pregnancy on their problem list.. Patient does intend to breast feed. Pregnancy history fully reviewed.  Patient reports no complaints.  HISTORY: OB History  Gravida Para Term Preterm AB Living  5 1 1  0 3 1  SAB IAB Ectopic Multiple Live Births  2 0 0 1 1    # Outcome Date GA Lbr Len/2nd Weight Sex Delivery Anes PTL Lv  5 Current           4A SAB 05/09/21 [redacted]w[redacted]d         4B Gravida           3 AB 12/31/20 [redacted]w[redacted]d    SAB     2 SAB 11/21/20          1 Term 09/01/20 [redacted]w[redacted]d 11:34 / 00:12 6 lb 1 oz (2.75 kg) M Vag-Spont EPI  LIV     Birth Comments: WNL     Name: Donna Simon     Apgar1: 8  Apgar5: 9   History reviewed. No pertinent past medical history. Past Surgical History:  Procedure Laterality Date   APPENDECTOMY Right 2009   Family History  Problem Relation Age of Onset   Hypertension Mother    Obesity Mother    Miscarriages / [place] Mother    Obesity Father    Social History   Tobacco Use   Smoking status: Never   Smokeless tobacco: Never  Vaping Use   Vaping Use: Never used  Substance Use Topics   Alcohol use: Not Currently   Drug use: Never   No Known Allergies Current Outpatient Medications on File Prior to Visit  Medication Sig Dispense Refill   Prenatal Vit-Fe Fumarate-FA (PRENATAL MULTIVITAMIN) TABS tablet Take 1 tablet by mouth daily at 12 noon.     progesterone (PROMETRIUM) 200 MG capsule Place one capsule vaginally at bedtime 30 capsule 3   No current facility-administered medications on file prior to visit.     Indications for ASA therapy (per uptodate) One of the following: Previous pregnancy with preeclampsia, especially early onset and with an adverse outcome  No Multifetal gestation No Chronic hypertension No Type 1 or 2 diabetes mellitus No Chronic kidney disease No Autoimmune disease (antiphospholipid syndrome, systemic lupus erythematosus) No   Two or more of the following: Nulliparity No Obesity (body mass index >30 kg/m2) No Family history of preeclampsia in mother or sister No Age ?35 years No Sociodemographic characteristics (African American race, low socioeconomic level) No Personal risk factors (eg, previous pregnancy with low birth weight or small for gestational age infant, previous adverse pregnancy outcome [eg, stillbirth], interval >10 years between pregnancies) No   Indications for early 1 hour GTT (per uptodate)  BMI >25 (>23 in Asian women) AND one of the following  Gestational diabetes mellitus in a previous pregnancy No Glycated hemoglobin ?5.7 percent (39 mmol/mol), impaired glucose tolerance, or impaired fasting glucose on previous testing No First-degree relative with diabetes No High-risk race/ethnicity (eg, African American, Latino, Native American, [place] American, Pacific Islander) Yes History of cardiovascular disease No Hypertension or on therapy for hypertension No High-density lipoprotein cholesterol level <35 mg/dL (0.90 mmol/L) and/or a triglyceride level >250 mg/dL (2.82 mmol/L) No Polycystic ovary syndrome No Physical inactivity No Other  clinical condition associated with insulin resistance (eg, severe obesity, acanthosis nigricans) No Previous birth of an infant weighing ?4000 g No Previous stillbirth of unknown cause No Exam   Vitals:   08/13/21 1312  BP: 120/68  Pulse: 73  Weight: 177 lb (80.3 kg)   Fetal Heart Rate (bpm): 150  Uterus:     Pelvic Exam: Perineum: no hemorrhoids, normal perineum   Vulva: normal external genitalia, no lesions   Vagina:  normal mucosa, normal discharge   Cervix: no lesions and normal, pap smear done.    Adnexa: normal adnexa and no mass, fullness, tenderness    Bony Pelvis: average  System: General: well-developed, well-nourished female in no acute distress   Breast:  normal appearance, no masses or tenderness   Skin: normal coloration and turgor, no rashes   Neurologic: oriented, normal, negative, normal mood   Extremities: normal strength, tone, and muscle mass, ROM of all joints is normal   HEENT PERRLA, extraocular movement intact and sclera clear, anicteric   Mouth/Teeth mucous membranes moist, pharynx normal without lesions and dental hygiene good   Neck supple and no masses   Cardiovascular: regular rate and rhythm   Respiratory:  no respiratory distress, normal breath sounds   Abdomen: soft, non-tender; bowel sounds normal; no masses,  no organomegaly     Assessment:   Pregnancy: [id number] Patient Active Problem List   Diagnosis Date Noted   Supervision of normal pregnancy 07/16/2021     Plan:  1. Encounter for supervision of other normal pregnancy in first trimester --Anticipatory guidance about next visits/weeks of pregnancy given. --Interested in [place], questions answered. Pt ttook class with previous pregnancy and had successful waterbirth.  She will be scheduled to see midwives for most visits. Discussed waterbirth as option for low risk pregnancy. Pregnancy is hard to predict and things may arise that prevent immersion in water but labor can be supported with freedom of movement, birthing ball, shower and birth can be supported in position of pt choosing, etc, even if waterbirth becomes unavailable. Pt states understanding.  --Next appt in 4 weeks  - Cervicovaginal ancillary only( [place]) - Obstetric Panel, Including HIV - Hepatitis C antibody - Genetic Screening - Culture, OB Urine - [place] MFM OB COMP + 14 WK; Future  2. 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 Continue prenatal  vitamins. Discussed and offered genetic screening options, including Quad screen/AFP, NIPS testing, and option to decline testing. Benefits/risks/alternatives reviewed. Pt aware that anatomy US is form of genetic screening with lower accuracy in detecting trisomies than blood work.  Pt chooses/declines genetic screening today. NIPS:  Panorama only, Horizon results from 2021, see media tab . Ultrasound discussed; fetal anatomic survey: requested. Problem list reviewed and updated. The nature of [facility] with multiple MDs and other Advanced Practice Providers was explained to patient; also emphasized that residents, students are part of our team. Routine obstetric precautions reviewed. Return in about 4 weeks (around 09/10/2021).   Fatima Blank, CNM 08/14/21 1:07 PM

## 2021-08-14 DIAGNOSIS — N96 Recurrent pregnancy loss: Secondary | ICD-10-CM | POA: Insufficient documentation

## 2021-08-14 LAB — OBSTETRIC PANEL, INCLUDING HIV
Antibody Screen: NEGATIVE
Basophils Absolute: 0 10*3/uL (ref 0.0–0.2)
Basos: 0 %
EOS (ABSOLUTE): 0 10*3/uL (ref 0.0–0.4)
Eos: 0 %
HIV Screen 4th Generation wRfx: NONREACTIVE
Hematocrit: 37.8 % (ref 34.0–46.6)
Hemoglobin: 12.9 g/dL (ref 11.1–15.9)
Hepatitis B Surface Ag: NEGATIVE
Immature Grans (Abs): 0 10*3/uL (ref 0.0–0.1)
Immature Granulocytes: 0 %
Lymphocytes Absolute: 2.2 10*3/uL (ref 0.7–3.1)
Lymphs: 26 %
MCH: 31.7 pg (ref 26.6–33.0)
MCHC: 34.1 g/dL (ref 31.5–35.7)
MCV: 93 fL (ref 79–97)
Monocytes Absolute: 0.6 10*3/uL (ref 0.1–0.9)
Monocytes: 7 %
Neutrophils Absolute: 5.5 10*3/uL (ref 1.4–7.0)
Neutrophils: 67 %
Platelets: 231 10*3/uL (ref 150–450)
RBC: 4.07 x10E6/uL (ref 3.77–5.28)
RDW: 13.8 % (ref 11.7–15.4)
RPR Ser Ql: NONREACTIVE
Rh Factor: POSITIVE
Rubella Antibodies, IGG: 2.76 index (ref >0.99)
WBC: 8.3 10*3/uL (ref 3.4–10.8)

## 2021-08-14 LAB — CERVICOVAGINAL ANCILLARY ONLY
Chlamydia: NEGATIVE
Comment: NEGATIVE
Comment: NORMAL
Neisseria Gonorrhea: NEGATIVE

## 2021-08-14 LAB — HEPATITIS C ANTIBODY: Hep C Virus Ab: 0.2 s/co ratio (ref 0.0–0.9)

## 2021-08-14 NOTE — BH Specialist Note (Signed)
Integrated Behavioral Health Initial In-Person Visit  MRN: [id number] Name: Donna Simon  Number of [place] Clinician visits:: 1/6 Session Start time: 130pm  Session End time: 202pm Total time: 32 minutes [facility] at [facility]   Types of Service: Individual psychotherapy  Interpretor:No. Interpretor Name and Language: none   Warm Hand Off Completed.        Subjective: Donna Simon is a 21 y.o. female accompanied by n/a Patient was referred by Corey Harold RN for  . Patient reports the following symptoms/concerns: situational stress Duration of problem: approx 6 months ; Severity of problem: mild  Objective: Mood: good and Affect: Appropriate Risk of harm to self or others: No plan to harm self or others  Life Context: Family and Social: father of baby and sister is Ms. Ashworth support system School/Work: work from home  Self-Care: n/a Life Changes: New pregnancy  Patient and/or Family's Strengths/Protective Factors: Concrete supports in place (healthy food, safe environments, etc.)  Goals Addressed: Patient will: Reduce symptoms of: stress Increase knowledge and/or ability of: healthy habits  Demonstrate ability to: Increase healthy adjustment to current life circumstances  Progress towards Goals: Ongoing  Interventions: Interventions utilized: Supportive Counseling  Standardized Assessments completed: PHQ 9   Assessment: Patient currently experiencing situational stress .   Patient may benefit from integrated behavioral health.  Plan: Follow up with behavioral health clinician on : as needed Behavioral recommendations: prioritize rest, delegate task to prevent burnout, take prenatal vitamins, practice coping and stress reducing skills  Referral(s): [place] (In Clinic) "From scale of 1-10, how likely are you to follow plan?":   Lynnea Ferrier, LCSW

## 2021-08-15 LAB — CULTURE, OB URINE

## 2021-08-15 LAB — URINE CULTURE, OB REFLEX: Organism ID, Bacteria: NO GROWTH

## 2021-08-29 ENCOUNTER — Other Ambulatory Visit: Payer: Self-pay | Admitting: *Deleted

## 2021-08-29 ENCOUNTER — Encounter: Payer: Self-pay | Admitting: Advanced Practice Midwife

## 2021-08-29 DIAGNOSIS — Z3481 Encounter for supervision of other normal pregnancy, first trimester: Secondary | ICD-10-CM

## 2021-08-29 NOTE — Progress Notes (Signed)
[facility] screening reports atypical finding on sex chromosomes. Dr. Donavan Foil consulted. MFM genetics referral placed.

## 2021-08-30 ENCOUNTER — Encounter: Payer: Self-pay | Admitting: Advanced Practice Midwife

## 2021-09-03 ENCOUNTER — Telehealth: Payer: Self-pay | Admitting: Advanced Practice Midwife

## 2021-09-03 NOTE — Telephone Encounter (Signed)
Called pt to review abnormal NIPS results with atypical sex chromosomes. Gender is reported as female.  Pt did not receive MyChart message sent by me on 08/30/21.  Today, I discussed the abnormal finding with pt and plan to follow up with genetic counseling and MFM.  Pt has [place] ordered for 09/27/21 with genetic counseling on the same day.  She asked if any testing/US could be done sooner and I called MFM and spoke with Dr Annamaria Boots.  Dr Annamaria Boots will order early anatomy US and schedule this in the next week, along with genetic counseling and notify patient.  I called Ms. Edgin back and notified her about plan from MFM and gave her number to call if needed.  Questions were answered and pt to f/u in our office as scheduled.

## 2021-09-04 NOTE — Addendum Note (Signed)
Addended by: Barton Dubois on: 09/04/2021 10:19 AM   Modules accepted: Orders

## 2021-09-10 ENCOUNTER — Ambulatory Visit (INDEPENDENT_AMBULATORY_CARE_PROVIDER_SITE_OTHER): Payer: Medicaid Other | Admitting: Advanced Practice Midwife

## 2021-09-10 ENCOUNTER — Other Ambulatory Visit: Payer: Self-pay

## 2021-09-10 ENCOUNTER — Telehealth: Payer: Self-pay | Admitting: Advanced Practice Midwife

## 2021-09-10 VITALS — BP 117/63 | HR 79 | Wt 179.4 lb

## 2021-09-10 DIAGNOSIS — Z3402 Encounter for supervision of normal first pregnancy, second trimester: Secondary | ICD-10-CM

## 2021-09-10 DIAGNOSIS — O280 Abnormal hematological finding on antenatal screening of mother: Secondary | ICD-10-CM

## 2021-09-10 DIAGNOSIS — Z3A16 16 weeks gestation of pregnancy: Secondary | ICD-10-CM

## 2021-09-10 DIAGNOSIS — O99612 Diseases of the digestive system complicating pregnancy, second trimester: Secondary | ICD-10-CM

## 2021-09-10 DIAGNOSIS — K5900 Constipation, unspecified: Secondary | ICD-10-CM

## 2021-09-10 MED ORDER — POLYETHYLENE GLYCOL 3350 17 GM/SCOOP PO POWD: 17.0000 g | Freq: Every day | ORAL | 0 refills | Status: DC

## 2021-09-10 NOTE — Progress Notes (Signed)
Patient presents for [name] and AFP. Patient complains of having constipation. She has tried prunes and apple juice with no relief.

## 2021-09-10 NOTE — Progress Notes (Signed)
° °  PRENATAL VISIT NOTE  Subjective:  Donna Simon is a 21 y.o. [phone] at [redacted]w[redacted]d being seen today for ongoing prenatal care.  She is currently monitored for the following issues for this low-risk pregnancy and has Supervision of normal pregnancy and History of multiple miscarriages on their problem list.  Patient reports  constipation .  Contractions: Not present. Vag. Bleeding: None.   . Denies leaking of fluid.   The following portions of the patient's history were reviewed and updated as appropriate: allergies, current medications, past family history, past medical history, past social history, past surgical history and problem list.   Objective:   Vitals:   09/10/21 1610  BP: 117/63  Pulse: 79  Weight: 179 lb 6.4 oz (81.4 kg)    Fetal Status: Fetal Heart Rate (bpm): 145         General:  Alert, oriented and cooperative. Patient is in no acute distress.  Skin: Skin is warm and dry. No rash noted.   Cardiovascular: Normal heart rate noted  Respiratory: Normal respiratory effort, no problems with respiration noted  Abdomen: Soft, gravid, appropriate for gestational age.  Pain/Pressure: Absent     Pelvic: Cervical exam deferred        Extremities: Normal range of motion.  Edema: None  Mental Status: Normal mood and affect. Normal behavior. Normal judgment and thought content.   Assessment and Plan:  Pregnancy: [id number] at [redacted]w[redacted]d 1. Encounter for supervision of normal first pregnancy in second trimester --Anticipatory guidance about next visits/weeks of pregnancy given.  --Next visit in 4 weeks - AFP, Serum, Open Spina Bifida  2. [redacted] weeks gestation of pregnancy   3. 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   --Pt asked about repeating Panorama test.  I called Ron Parker, representative for [facility] L[facility], who reported that repeat test not recommended per [facility] but that if pt  wanted to repeat test, she would flag it for no cost to the patient.   --Panorama test repeated today along with AFP --Pt has appt for phone counseling with [facility] genetic counselor on 09/11/21, then will keep appt with MFM on 09/13/21 - [place] MFM OB LIMITED; Future  4. Constipation during pregnancy in second trimester  - polyethylene glycol powder (GLYCOLAX/MIRALAX) 17 GM/SCOOP powder; Take 17 g by mouth daily.  Dispense: 255 g; Refill: 0   Preterm labor symptoms and general obstetric precautions including but not limited to vaginal bleeding, contractions, leaking of fluid and fetal movement were reviewed in detail with the patient. Please refer to After Visit Summary for other counseling recommendations.   No follow-ups on file.  Future Appointments  Date Time Provider Department Center  09/13/2021 10:30 AM [facility] NURSE [facility]  09/13/2021 10:45 AM [facility] US5 [facility]  09/13/2021  1:00 PM [facility] GENETIC COUNSELING RM [facility]  09/27/2021 12:45 PM [facility] NURSE [facility]  09/27/2021  1:00 PM [facility] US1 [facility]  10/08/2021  2:50 PM Leftwich-Kirby, Wilmer Floor, CNM [facility] None    Sharen Counter, CNM

## 2021-09-10 NOTE — Telephone Encounter (Signed)
After patient OB visit today, per her request, I called MFM to ask if amniocentesis is offered same day if patient opts to have the procedure. Per Dr Judeth Cornfield, the test is usually done same day or the next day if needed.  Also, Dr Judeth Cornfield looked at the pt chart and recommended doing a limited OB US on 09/13/21, instead of a complete US, and keep the pt anatomy US for 09/27/21 as originally scheduled.  I changed the order, sent a message to the [facility] front desk to change the scheduled [place] with MFM during business hours tomorrow, and called the patient.  I let her know that Judeth Cornfield reviewed the result and noted that many atypical sex chromosome findings turn out to be false positives, and that the ultrasound and genetic counseling will help her decide if she wants to proceed with the amniocentesis as further testing.  Donna Simon states understanding and will proceed with her appt on 2/9.

## 2021-09-12 LAB — AFP, SERUM, OPEN SPINA BIFIDA
AFP MoM: 1.18
AFP Value: 38.9 ng/mL
Gest. Age on Collection Date: 16.4 weeks
Maternal Age At EDD: 22.7 yr
OSBR Risk 1 IN: 6916
Test Results:: NEGATIVE
Weight: 179 [lb_av]

## 2021-09-13 ENCOUNTER — Encounter: Payer: Self-pay | Admitting: *Deleted

## 2021-09-13 ENCOUNTER — Other Ambulatory Visit: Payer: Self-pay

## 2021-09-13 ENCOUNTER — Ambulatory Visit (HOSPITAL_BASED_OUTPATIENT_CLINIC_OR_DEPARTMENT_OTHER): Payer: Medicaid Other

## 2021-09-13 ENCOUNTER — Ambulatory Visit: Payer: Medicaid Other | Admitting: *Deleted

## 2021-09-13 ENCOUNTER — Ambulatory Visit: Payer: Medicaid Other

## 2021-09-13 ENCOUNTER — Ambulatory Visit: Payer: Medicaid Other | Attending: Advanced Practice Midwife

## 2021-09-13 ENCOUNTER — Other Ambulatory Visit: Payer: Self-pay | Admitting: Advanced Practice Midwife

## 2021-09-13 VITALS — BP 103/53 | HR 69

## 2021-09-13 DIAGNOSIS — O285 Abnormal chromosomal and genetic finding on antenatal screening of mother: Secondary | ICD-10-CM | POA: Insufficient documentation

## 2021-09-13 DIAGNOSIS — O280 Abnormal hematological finding on antenatal screening of mother: Secondary | ICD-10-CM | POA: Diagnosis not present

## 2021-09-13 DIAGNOSIS — Z36 Encounter for antenatal screening for chromosomal anomalies: Secondary | ICD-10-CM

## 2021-09-13 DIAGNOSIS — Z3A16 16 weeks gestation of pregnancy: Secondary | ICD-10-CM | POA: Insufficient documentation

## 2021-09-13 NOTE — Addendum Note (Signed)
Addended by: Teena Dunk on: 09/13/2021 12:05 PM   Modules accepted: Orders

## 2021-09-13 NOTE — Addendum Note (Signed)
Addended by: Staci Righter on: 09/13/2021 12:08 PM   Modules accepted: Orders

## 2021-09-13 NOTE — Progress Notes (Signed)
°  Name: Tamekia Rotter Indication: Atypical NIPS  DOB: 11/24/2000 Age: 21 y.o.   EDC: 02/22/2022 LMP: 05/11/2021 Referring Provider:  Hurshel Party  EGA: [redacted]w[redacted]d Genetic Counselor: Teena Dunk, MS, CGC  OB Hx: [id number] Date of Appointment: 09/13/2021  Accompanied by: Her reproductive partner Face to Face Time: 40 Minutes    Previous Testing Completed: Nykeria previously completed Expanded Horizon Carrier Screening (scanned into Epic under the Media tab). She screened to not be a carrier for several autosomal recessive and x-linked conditons. A negative result on carrier screening reduces the likelihood of being a carrier, however, does not entirely rule out the possibility.      Genetic Counseling: Shary Key previously completed genetic counseling on 06/14/2021 to discuss her history of recurrent pregnancy loss and her family history. Please see separate consult note for details.  Atypical Non-Invasive Prenatal Screen Result. Talaya previously completed Non-Invasive Prenatal Screening (NIPS) in this pregnancy (scanned into Epic under the Media tab). The result is low risk for Down syndrome, Trisomy 67, Trisomy 85, and Triploidy. The laboratory reports a suspected fetal (placental) finding outside the scope of the test involving the X chromosome, which may include, but is not limited to, fetal mosaicism, fetal chromosome abnormality, or normal variation. Fetal risk assessment for monosomy X could not be performed. Genetic counseling reviewed this result in detail with [facility] and her partner and offered amniocentesis for fetal chromosome analysis and microarray studies. After hearing the information Stuti accepted amniocentesis for prenatal diagnosis.      Patient Plan:   Proceed with: Amniocentesis for prenatal diagnosis. Testing ordered on the amniotic fluid sample includes AF-AFP, FISH, and a fetal karyotype with reflex to a fetal microarray if the karyotype is normal.  All  questions were answered.    Thank you for sharing in the care of Kaely with [place].  Please do not hesitate to contact us if you have any questions.   Teena Dunk, MS, [facility]

## 2021-09-17 ENCOUNTER — Telehealth: Payer: Self-pay | Admitting: Genetics

## 2021-09-17 NOTE — Telephone Encounter (Signed)
Returned normal AF-AFP and FISH result to [place]. The fetal karyotype and reflex to microarray are pending.

## 2021-09-21 ENCOUNTER — Encounter: Payer: Self-pay | Admitting: Advanced Practice Midwife

## 2021-09-26 LAB — MCC TRACKING

## 2021-09-27 ENCOUNTER — Other Ambulatory Visit: Payer: Self-pay

## 2021-09-27 ENCOUNTER — Encounter: Payer: Self-pay | Admitting: Genetics

## 2021-09-27 ENCOUNTER — Ambulatory Visit: Payer: Medicaid Other | Attending: Obstetrics and Gynecology

## 2021-09-27 ENCOUNTER — Ambulatory Visit: Payer: Medicaid Other

## 2021-09-27 ENCOUNTER — Ambulatory Visit: Payer: Medicaid Other | Admitting: *Deleted

## 2021-09-27 VITALS — BP 112/62 | HR 78

## 2021-09-27 DIAGNOSIS — O285 Abnormal chromosomal and genetic finding on antenatal screening of mother: Secondary | ICD-10-CM

## 2021-09-27 DIAGNOSIS — Z3689 Encounter for other specified antenatal screening: Secondary | ICD-10-CM | POA: Diagnosis not present

## 2021-09-27 DIAGNOSIS — Z3481 Encounter for supervision of other normal pregnancy, first trimester: Secondary | ICD-10-CM

## 2021-09-27 DIAGNOSIS — Z3A18 18 weeks gestation of pregnancy: Secondary | ICD-10-CM | POA: Diagnosis not present

## 2021-09-27 DIAGNOSIS — Z363 Encounter for antenatal screening for malformations: Secondary | ICD-10-CM | POA: Diagnosis present

## 2021-09-27 DIAGNOSIS — Z3482 Encounter for supervision of other normal pregnancy, second trimester: Secondary | ICD-10-CM

## 2021-09-27 NOTE — Progress Notes (Signed)
Returned normal amniotic fluid chromosome analysis (karyotype) result to Donna Simon when she was in the office on 09/27/21 for ultrasound. The karyotype is normal. The microarray is pending. All questions answered.

## 2021-09-28 ENCOUNTER — Ambulatory Visit: Payer: Medicaid Other

## 2021-10-08 ENCOUNTER — Encounter: Payer: Self-pay | Admitting: Advanced Practice Midwife

## 2021-10-08 ENCOUNTER — Other Ambulatory Visit: Payer: Self-pay

## 2021-10-08 ENCOUNTER — Ambulatory Visit (INDEPENDENT_AMBULATORY_CARE_PROVIDER_SITE_OTHER): Payer: Medicaid Other | Admitting: Advanced Practice Midwife

## 2021-10-08 VITALS — BP 110/72 | HR 78 | Wt 178.0 lb

## 2021-10-08 DIAGNOSIS — Z3A20 20 weeks gestation of pregnancy: Secondary | ICD-10-CM

## 2021-10-08 DIAGNOSIS — O280 Abnormal hematological finding on antenatal screening of mother: Secondary | ICD-10-CM

## 2021-10-08 DIAGNOSIS — Z3402 Encounter for supervision of normal first pregnancy, second trimester: Secondary | ICD-10-CM

## 2021-10-08 NOTE — Progress Notes (Signed)
Patient presents for [name]. Reports continued concern with constipation. States that she can not keep mira lax down. States without laxative, she can go 7 days without BM. Want's to try natural supplement like "Prune-Lax." Also has questions about safety of supplements during pregnancy such as collagen and probiotics.  ?

## 2021-10-08 NOTE — Progress Notes (Signed)
? ?  PRENATAL VISIT NOTE ? ?Subjective:  ?Donna Simon is a 21 y.o. [phone] at [redacted]w[redacted]d being seen today for ongoing prenatal care.  She is currently monitored for the following issues for this low-risk pregnancy and has Supervision of normal pregnancy and History of multiple miscarriages on their problem list. ? ?Patient reports no complaints.  Contractions: Not present. Vag. Bleeding: None.  Movement: Absent. Denies leaking of fluid.  ? ?The following portions of the patient's history were reviewed and updated as appropriate: allergies, current medications, past family history, past medical history, past social history, past surgical history and problem list.  ? ?Objective:  ? ?Vitals:  ? 10/08/21 1503  ?BP: 110/72  ?Pulse: 78  ?Weight: 178 lb (80.7 kg)  ? ? ?Fetal Status: Fetal Heart Rate (bpm): 150   Movement: Absent    ? ?General:  Alert, oriented and cooperative. Patient is in no acute distress.  ?Skin: Skin is warm and dry. No rash noted.   ?Cardiovascular: Normal heart rate noted  ?Respiratory: Normal respiratory effort, no problems with respiration noted  ?Abdomen: Soft, gravid, appropriate for gestational age.  Pain/Pressure: Absent     ?Pelvic: Cervical exam deferred        ?Extremities: Normal range of motion.  Edema: None  ?Mental Status: Normal mood and affect. Normal behavior. Normal judgment and thought content.  ? ?Assessment and Plan:  ?Pregnancy: [id number] at [redacted]w[redacted]d ?1. Encounter for supervision of normal first pregnancy in second trimester ?--Anticipatory guidance about next visits/weeks of pregnancy given.  ?--Questions answered about exercise and diet in pregnancy, pt trying to eat healthier in this pregnancy. Discussed recommended weight gain for normal pregnancy, pt encouraged to eat enough to gain wait appropriately and should not lose in pregnancy.  ?--next visit in 4 weeks ? ?2. Abnormal maternal serum screening test ?--NIPS with atypical sex chromosomes, follow up US and amniocentesis are  wnl ?--Reviewed results again, reassurance provided, pt relieved about her recent normal results and glad she proceeded with the amniocentesis ? ?3. [redacted] weeks gestation of pregnancy ? ? ?Preterm labor symptoms and general obstetric precautions including but not limited to vaginal bleeding, contractions, leaking of fluid and fetal movement were reviewed in detail with the patient. ?Please refer to After Visit Summary for other counseling recommendations.  ? ?Return in about 4 weeks (around 11/05/2021). ? ?Future Appointments  ?Date Time Provider Department Center  ?11/05/2021  9:35 AM Leftwich-Kirby, Wilmer Floor, CNM [facility] None  ? ? ? ?Sharen Counter, CNM  ?

## 2021-10-22 ENCOUNTER — Telehealth: Payer: Self-pay

## 2021-10-22 NOTE — Telephone Encounter (Signed)
Returned call, pt reports swelling with some pitting edema in ankles and legs. Pt reports that she has been elevating and soaking her feet with no relief. Pt took BP and reported 124/57, denies headache, numbness, and blurred vision. Pt scheduled to be evaluated in office ?

## 2021-10-22 NOTE — Telephone Encounter (Signed)
[facility] called with new estimated results on the Microaray by 10/26/21. Call back Jasmine December [phone] ? ?

## 2021-10-23 ENCOUNTER — Other Ambulatory Visit: Payer: Self-pay

## 2021-10-23 ENCOUNTER — Ambulatory Visit (INDEPENDENT_AMBULATORY_CARE_PROVIDER_SITE_OTHER): Payer: Medicaid Other | Admitting: Obstetrics and Gynecology

## 2021-10-23 VITALS — BP 126/64 | HR 73 | Wt 186.0 lb

## 2021-10-23 DIAGNOSIS — Z3482 Encounter for supervision of other normal pregnancy, second trimester: Secondary | ICD-10-CM

## 2021-10-23 DIAGNOSIS — O1202 Gestational edema, second trimester: Secondary | ICD-10-CM

## 2021-10-23 DIAGNOSIS — Z3A22 22 weeks gestation of pregnancy: Secondary | ICD-10-CM

## 2021-10-23 DIAGNOSIS — O2242 Hemorrhoids in pregnancy, second trimester: Secondary | ICD-10-CM

## 2021-10-23 LAB — MCC TRACKING

## 2021-10-23 MED ORDER — DOCUSATE SODIUM 100 MG PO CAPS: 100.0000 mg | ORAL_CAPSULE | Freq: Two times a day (BID) | ORAL | 2 refills | Status: DC | PRN

## 2021-10-23 NOTE — Progress Notes (Signed)
Pt had been having some LE swelling and has been concerned.  ?Pt complain of pain in ankles and legs as well.  Pt has some trouble with heartburn and "digestion issues" states she feels her food doesn't digest and she will vomit.  ? ?

## 2021-10-23 NOTE — Progress Notes (Signed)
? ?  PRENATAL VISIT NOTE ? ?Subjective:  ?Donna Simon is a 21 y.o. [id number] at [redacted]w[redacted]d being seen today for ongoing prenatal care.  She is currently monitored for the following issues for this low-risk pregnancy and has Supervision of normal pregnancy; History of multiple miscarriages; and Hemorrhoids during pregnancy in second trimester on their problem list. ? ?Patient doing well with no acute concerns today. She reports heartburn and lower extremity edema .  Pt also complains of lower extremity cramping as well as constipation/hemorrhoid discomfort.  Some reflux like discomfort noted as well.  Contractions: Not present.  .  Movement: Absent. Denies leaking of fluid.  ? ?The following portions of the patient's history were reviewed and updated as appropriate: allergies, current medications, past family history, past medical history, past social history, past surgical history and problem list. Problem list updated. ? ?Objective:  ? ?Vitals:  ? 10/23/21 1037  ?BP: 126/64  ?Pulse: 73  ?Weight: 186 lb (84.4 kg)  ? ? ?Fetal Status: Fetal Heart Rate (bpm): 150 Fundal Height: 22 cm Movement: Absent    ? ?General:  Alert, oriented and cooperative. Patient is in no acute distress.  ?Skin: Skin is warm and dry. No rash noted.   ?Cardiovascular: Normal heart rate noted  ?Respiratory: Normal respiratory effort, no problems with respiration noted  ?Abdomen: Soft, gravid, appropriate for gestational age.  Pain/Pressure: Absent     ?Pelvic: Cervical exam deferred        ?Extremities: Normal range of motion.   Trace bilateral lower ext edema  ?Mental Status:  Normal mood and affect. Normal behavior. Normal judgment and thought content.  ? ?Assessment and Plan:  ?Pregnancy: [id number] at [redacted]w[redacted]d ? ?1. [redacted] weeks gestation of pregnancy ? ? ?2. Encounter for supervision of other normal pregnancy in second trimester ?Pt reassured that most of her complaints are common in pregnancy.  Emphasized lower extremity swelling is common and that  hers today look normal.  Advised elevation of lower extremities above the level of the heart.  Offered pepcid for reflux, but pt declined. ? ?3. Hemorrhoids during pregnancy in second trimester ?Advised to increase hydration, but will also add colace for comfort ? ?- docusate sodium (COLACE) 100 MG capsule; Take 1 capsule (100 mg total) by mouth 2 (two) times daily as needed.  Dispense: 30 capsule; Refill: 2 ? ?4. Swelling of lower extremity during pregnancy in second trimester ?See as above ? ?Preterm labor symptoms and general obstetric precautions including but not limited to vaginal bleeding, contractions, leaking of fluid and fetal movement were reviewed in detail with the patient. ? ?Please refer to After Visit Summary for other counseling recommendations.  ? ?Follow up as previously scheduled. ? ? ?Mariel Aloe, MD ?Faculty Attending ?[facility] ?  ?

## 2021-10-24 ENCOUNTER — Telehealth: Payer: Self-pay | Admitting: Genetics

## 2021-10-24 LAB — CHROMOSOME ANALYSIS W REFLEX TO SNP, AMNIOTIC
Cells Analyzed: 15
Cells Counted: 15
Cells Karyotyped: 2
Colonies: 15
GTG Band Resolution Achieved: 450

## 2021-10-24 LAB — CHROMOSOME MICROARRAY REFLEX, AMN FLD

## 2021-10-24 LAB — INSIGHT AMNIO FISH XY,13,18,21
Cells Analyzed: 50
Cells Counted: 50

## 2021-10-24 LAB — AFP, AMNIOTIC FLUID
AFP, Amniotic Fluid (mcg/ml): 15.1 ug/mL
Gestational Age(Wks): 16
MOM, Amniotic Fluid: 1.12

## 2021-10-24 LAB — MATERNAL CELL CONTAMINATION

## 2021-10-24 NOTE — Telephone Encounter (Signed)
Called Donna Simon to return fetal microarray results. Left voicemail with [facility] call back number. ?

## 2021-10-25 ENCOUNTER — Ambulatory Visit: Payer: Medicaid Other

## 2021-10-25 ENCOUNTER — Telehealth: Payer: Self-pay | Admitting: Obstetrics and Gynecology

## 2021-10-25 NOTE — Telephone Encounter (Signed)
I spoke with Ms. Donna Simon regarding the results of the chromosomal microarray performed on her recent amniocentesis sample.  Prior results showed a normal fetal karyotype (46,XX) and maternal cell contamination was not present.  The microarray result is reported as follows: ? ?   arr[hg19] 7p22.1(6,037,640-6,122,672)x1  ? ?      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  ?      This interval includes 3 OMIM genes (PMS2, AIMP2,  ?GYB6LS9), including the tumor suppressor gene, PMS2.  ?Sequence variants and deletions in PMS2 are associated with  ?Lynch syndrome, which is characterized by an increased risk  ?for colorectal cancer and other tumors including the GI,  ?urological, female reproductive tract, and skin (see  ?references). Clinical management recommendations for PMS2  ?may be found at [email or web address]. This region is known to have  ?homology with PMS2 pseudogenes, and therefore, targeted PMS2  ?testing should be considered.  ?      Parental analysis is necessary to determine whether  ?this alteration represents a familial variant or a de novo  ?change. It is important to recognize that members of the  ?same family who carry a PMS2 sequence variant may or may not  ?be affected (incomplete penetrance), and may have different  ?types of cancer (variable expressivity).  ?      No other DNA copy number changes or copy neutral ROH  ?were detected within the present reporting criteria. Genetic  ?counseling is recommended to discuss this deletion, possible  ?additional testing and evaluate the family history of  ?cancer.  ? ?I reviewed with the patient the recommendation that our next step should be parental testing to determine if this deletion is present in either she or her partner, Donna Simon (date of birth 09/01/1994).  After consultation with the laboratory, we reassured the patient that two of the genes in this  region are not expected to cause a know genetic condition/phenotype, however, deletions of the PMS2 gene are associated with Lynch syndrome.  We explained that this syndrome is not expected to result in birth defects or developmental differences but is known to be associated with an increased risk for several types of cancer in adulthood.  We stressed that prenatal or childhood testing for cancer predisposition is not typically performed but was an incidental finding as a result of the microarray. Once the results of parental testing are available, we will speak further with the patient and her partner.  If one of them is identified to have the same deletion, that would be further reassurance that this deletion is unlikely to result in birth defects or developmental delays.  We will then be happy to arrange a referral to a cancer genetic counselor to explore clarification of the diagnosis of Lynch syndrome as well as any screening recommendations. The cancer genetic counselor will also be able to address questions regarding monitoring of their child at the appropriate ages if needed. ? ?We are happy to speak further and answer any questions at [phone]. ? ?Wilburt Finlay, MS, CGC ? ? ? ?

## 2021-10-26 ENCOUNTER — Other Ambulatory Visit: Payer: Self-pay

## 2021-10-26 ENCOUNTER — Ambulatory Visit: Payer: Medicaid Other | Attending: Obstetrics and Gynecology

## 2021-10-26 DIAGNOSIS — O285 Abnormal chromosomal and genetic finding on antenatal screening of mother: Secondary | ICD-10-CM

## 2021-11-05 ENCOUNTER — Encounter: Payer: Medicaid Other | Admitting: Advanced Practice Midwife

## 2021-11-05 NOTE — Progress Notes (Deleted)
? ?  PRENATAL VISIT NOTE ? ?Subjective:  ?Donna Simon is a 21 y.o. G5P1031 at [redacted]w[redacted]d being seen today for ongoing prenatal care.  She is currently monitored for the following issues for this {Blank single:19197::"high-risk","low-risk"} pregnancy and has Supervision of normal pregnancy; History of multiple miscarriages; and Hemorrhoids during pregnancy in second trimester on their problem list. ? ?Patient reports {sx:14538}.   .  .   . Denies leaking of fluid.  ? ?The following portions of the patient's history were reviewed and updated as appropriate: allergies, current medications, past family history, past medical history, past social history, past surgical history and problem list.  ? ?Objective:  ?There were no vitals filed for this visit. ? ?Fetal Status:          ? ?General:  Alert, oriented and cooperative. Patient is in no acute distress.  ?Skin: Skin is warm and dry. No rash noted.   ?Cardiovascular: Normal heart rate noted  ?Respiratory: Normal respiratory effort, no problems with respiration noted  ?Abdomen: Soft, gravid, appropriate for gestational age.        ?Pelvic: {Blank single:19197::"Cervical exam performed in the presence of a chaperone","Cervical exam deferred"}        ?Extremities: Normal range of motion.     ?Mental Status: Normal mood and affect. Normal behavior. Normal judgment and thought content.  ? ?Assessment and Plan:  ?Pregnancy: [id number] at [redacted]w[redacted]d ?1. Encounter for supervision of other normal pregnancy in second trimester ?*** ? ?2. [redacted] weeks gestation of pregnancy ?*** ? ?3. Abnormal maternal serum screening test ?***  ?{Blank single:19197::"Term","Preterm"} labor symptoms and general obstetric precautions including but not limited to vaginal bleeding, contractions, leaking of fluid and fetal movement were reviewed in detail with the patient. ?Please refer to After Visit Summary for other counseling recommendations.  ? ?No follow-ups on file. ? ?Future Appointments  ?Date Time  Provider Department Center  ?11/05/2021  9:35 AM Leftwich-Kirby, Wilmer Floor, CNM [facility] None  ? ? ?Sharen Counter, CNM  ?

## 2021-11-07 ENCOUNTER — Telehealth: Payer: Self-pay | Admitting: Genetics

## 2021-11-07 ENCOUNTER — Ambulatory Visit (INDEPENDENT_AMBULATORY_CARE_PROVIDER_SITE_OTHER): Payer: Medicaid Other | Admitting: Obstetrics and Gynecology

## 2021-11-07 VITALS — BP 108/66 | HR 80 | Wt 184.0 lb

## 2021-11-07 DIAGNOSIS — Z3482 Encounter for supervision of other normal pregnancy, second trimester: Secondary | ICD-10-CM

## 2021-11-07 LAB — REVEAL (SM) ADJUNCT MICROARRAY

## 2021-11-07 LAB — PRENATAL PARENTAL CMA FOLLOWUP

## 2021-11-07 NOTE — Progress Notes (Signed)
? ?  PRENATAL VISIT NOTE ? ?Subjective:  ?Donna Simon is a 21 y.o. G5P1031 at [redacted]w[redacted]d being seen today for ongoing prenatal care.  She is currently monitored for the following issues for this low-risk pregnancy and has Supervision of normal pregnancy; History of multiple miscarriages; and Hemorrhoids during pregnancy in second trimester on their problem list. ? ?Patient reports no complaints.  Contractions: Not present. Vag. Bleeding: None.  Movement: Present. Denies leaking of fluid.  ? ?The following portions of the patient's history were reviewed and updated as appropriate: allergies, current medications, past family history, past medical history, past social history, past surgical history and problem list.  ? ?Objective:  ? ?Vitals:  ? 11/07/21 1602  ?BP: 108/66  ?Pulse: 80  ?Weight: 184 lb (83.5 kg)  ? ? ?Fetal Status: Fetal Heart Rate (bpm): 157 Fundal Height: 23 cm Movement: Present    ? ?General:  Alert, oriented and cooperative. Patient is in no acute distress.  ?Skin: Skin is warm and dry. No rash noted.   ?Cardiovascular: Normal heart rate noted  ?Respiratory: Normal respiratory effort, no problems with respiration noted  ?Abdomen: Soft, gravid, appropriate for gestational age.  Pain/Pressure: Absent     ?Pelvic: Cervical exam deferred        ?Extremities: Normal range of motion.  Edema: Trace  ?Mental Status: Normal mood and affect. Normal behavior. Normal judgment and thought content.  ? ?Assessment and Plan:  ?Pregnancy: [id number] at [redacted]w[redacted]d ? ?1. Encounter for supervision of other normal pregnancy in second trimester ? ?Doing well ?Still breastfeeding her 35.23 year old. Concerns about braxton hicks contractions during feedings. Encouraged higher caloric intake while BF, and large amounts of water. If contractions continue it may be best to ween.  ? ?Preterm labor symptoms and general obstetric precautions including but not limited to vaginal bleeding, contractions, leaking of fluid and fetal movement were  reviewed in detail with the patient. ?Please refer to After Visit Summary for other counseling recommendations.  ? ?Return in about 4 weeks (around 12/05/2021), or Low risk OB, 2 hour GTT. ? ?Future Appointments  ?Date Time Provider Department Center  ?12/06/2021 10:00 AM Flippin, Jerold Coombe, Counselor [facility] None  ?12/06/2021 11:00 AM [facility] None  ? ? ?Venia Carbon, NP  ?

## 2021-11-07 NOTE — Telephone Encounter (Signed)
I spoke with Donna Simon regarding the results of the parental follow-up microarray analysis. 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 Her partner Donna Simon) tested to not carry the deletion.  ? ?We reviewed with Donna Simon that two of the genes in the deleted region are not expected to cause any known genetic conditions/phenotypes, however, deletions of the PMS2 gene are associated with Lynch syndrome. This syndrome is not expected to result in birth defects or developmental differences but is known to be associated with an increased risk for several types of cancer in adulthood. We discussed that because Donna Simon was found to carry this deletion this provides further reassurance that the deletion is unlikely to result in birth defects or developmental delays. We offered Donna Simon a referral for a consultation with a cancer genetic counselor which she accepted. The cancer genetic counselor could help with further clarification of the diagnosis of Lynch syndrome as well as any screening recommendations. The cancer genetic counselor will also be able to address questions regarding monitoring of their child at the appropriate ages if needed.  ?

## 2021-11-07 NOTE — Progress Notes (Signed)
[name], wants to know if it is Ok to continue breastfeeding her 1 yr. Old?. ?

## 2021-11-17 ENCOUNTER — Other Ambulatory Visit: Payer: Self-pay

## 2021-11-17 ENCOUNTER — Inpatient Hospital Stay (HOSPITAL_COMMUNITY)
Admission: AD | Admit: 2021-11-17 | Discharge: 2021-11-18 | Disposition: A | Discharge status: Home / Self Care | Payer: Medicaid Other | Attending: Obstetrics & Gynecology | Admitting: Obstetrics & Gynecology

## 2021-11-17 DIAGNOSIS — M545 Low back pain, unspecified: Secondary | ICD-10-CM | POA: Insufficient documentation

## 2021-11-17 DIAGNOSIS — O368120 Decreased fetal movements, second trimester, not applicable or unspecified: Secondary | ICD-10-CM | POA: Insufficient documentation

## 2021-11-17 DIAGNOSIS — Z3A26 26 weeks gestation of pregnancy: Secondary | ICD-10-CM

## 2021-11-17 DIAGNOSIS — O26892 Other specified pregnancy related conditions, second trimester: Secondary | ICD-10-CM | POA: Insufficient documentation

## 2021-11-17 DIAGNOSIS — Z3689 Encounter for other specified antenatal screening: Secondary | ICD-10-CM

## 2021-11-17 NOTE — MAU Note (Addendum)
.  Donna Simon is a 21 y.o. at [redacted]w[redacted]d here in MAU reporting: Braxton Hicks contractions today-pt is still breastfeeding her 58yr. Old. Pt reports decreased FM with last movement last night. States her day was busy and she may have just not felt her. Also reporting back pain(lower)-constant -rate 3. Also concerned that she lost her mucous plug today. Spent the day at the pool. ?Denies SROM, vaginal bleeding or bloody show. Denies regular or painful contractions. ? ?There were no vitals filed for this visit.   ?FHT:154bpm ?  ?

## 2021-11-18 DIAGNOSIS — Z3A26 26 weeks gestation of pregnancy: Secondary | ICD-10-CM | POA: Diagnosis not present

## 2021-11-18 DIAGNOSIS — O368120 Decreased fetal movements, second trimester, not applicable or unspecified: Secondary | ICD-10-CM | POA: Diagnosis not present

## 2021-11-18 DIAGNOSIS — M545 Low back pain, unspecified: Secondary | ICD-10-CM | POA: Diagnosis not present

## 2021-11-18 DIAGNOSIS — O26892 Other specified pregnancy related conditions, second trimester: Secondary | ICD-10-CM

## 2021-11-18 LAB — URINALYSIS, ROUTINE W REFLEX MICROSCOPIC
Bilirubin Urine: NEGATIVE
Glucose, UA: NEGATIVE mg/dL
Hgb urine dipstick: NEGATIVE
Ketones, ur: 5 mg/dL — AB
Leukocytes,Ua: NEGATIVE
Nitrite: NEGATIVE
Protein, ur: NEGATIVE mg/dL
Specific Gravity, Urine: 1.025 (ref 1.005–1.030)
pH: 7 (ref 5.0–8.0)

## 2021-11-18 NOTE — MAU Provider Note (Signed)
?History  ?  ? ?CSN: [id number] ? ?Arrival date and time: 11/17/21 2318 ? ? Event Date/Time  ? First Provider Initiated Contact with Patient 11/18/21 0030   ?  ? ?Chief Complaint  ?Patient presents with  ? Decreased Fetal Movement  ? Back Pain  ? ?Donna Simon is a 21 y.o. D6Q2297 at [redacted]w[redacted]d who receives care at [facility].  She presents today for Decreased Fetal Movement and Back Pain.  She reports she has been having consistent back pain since "earlier today."  She states the pain is "relieved with bending, applying pressure" or walking.  She states that the pain was worsened as the day progressed. She rates the pain a 3/10. She also states that earlier tonight she lost what she believes to be her mucous plug or part of it. She reports seeing a "blob of off-white mucous" while using the bathroom around 10pm. She reports she hadn't felt fetal movement since last night, but has experienced movement since arrival. She states she has continued to breastfeed her [age] year and experiences some BH contractions that are stronger during feedings.  ? ? ?OB History   ? ? Gravida  ?5  ? Para  ?1  ? Term  ?1  ? Preterm  ?   ? AB  ?3  ? Living  ?1  ?  ? ? SAB  ?2  ? IAB  ?   ? Ectopic  ?   ? Multiple  ?0  ? Live Births  ?1  ?   ?  ?  ? ? ?No past medical history on file. ? ?Past Surgical History:  ?Procedure Laterality Date  ? APPENDECTOMY Right 2009  ? ? ?Family History  ?Problem Relation Age of Onset  ? Hypertension Mother   ? Obesity Mother   ? Miscarriages / [place] Mother   ? Obesity Father   ? ? ?Social History  ? ?Tobacco Use  ? Smoking status: Never  ? Smokeless tobacco: Never  ?Vaping Use  ? Vaping Use: Never used  ?Substance Use Topics  ? Alcohol use: Not Currently  ? Drug use: Never  ? ? ?Allergies: No Known Allergies ? ?Medications Prior to Admission  ?Medication Sig Dispense Refill Last Dose  ? Prenatal Vit-Fe Fumarate-FA (PRENATAL MULTIVITAMIN) TABS tablet Take 1 tablet by mouth daily at 12 noon.   11/17/2021  ?  Probiotic Product (PROBIOTIC-10 PO) Take by mouth.   11/17/2021  ? docusate sodium (COLACE) 100 MG capsule Take 1 capsule (100 mg total) by mouth 2 (two) times daily as needed. 30 capsule 2   ? ? ?Review of Systems  ?Gastrointestinal:  Positive for constipation (Yesterday, hard to pass. Taking stool softener). Negative for abdominal pain, diarrhea, nausea and vomiting.  ?Genitourinary:  Negative for difficulty urinating, dysuria, vaginal bleeding and vaginal discharge.  ?Musculoskeletal:  Positive for back pain.  ?Neurological:  Negative for dizziness, light-headedness and headaches.  ?Physical Exam  ? ?Blood pressure 124/60, pulse 87, temperature 98.7 ?F (37.1 ?C), temperature source Oral, resp. rate 17, height 5\' 5"  (1.651 m), weight 86.6 kg, SpO2 100 %, not currently breastfeeding. ? ?Physical Exam ?Vitals reviewed. Exam conducted with a chaperone present.  ?Constitutional:   ?   Appearance: Normal appearance.  ?HENT:  ?   Head: Normocephalic and atraumatic.  ?Eyes:  ?   Conjunctiva/sclera: Conjunctivae normal.  ?Cardiovascular:  ?   Rate and Rhythm: Normal rate and regular rhythm.  ?Abdominal:  ?   Palpations: Abdomen is soft.  ?  Tenderness: There is no abdominal tenderness.  ?   Comments: Gravid  ?Musculoskeletal:     ?   General: Normal range of motion.  ?   Cervical back: Normal range of motion.  ?   Right lower leg: No edema.  ?   Left lower leg: No edema.  ?Skin: ?   General: Skin is warm and dry.  ?Neurological:  ?   Mental Status: She is alert and oriented to person, place, and time.  ?Psychiatric:     ?   Mood and Affect: Mood normal.     ?   Behavior: Behavior normal.     ?   Thought Content: Thought content normal.  ? ? ?Fetal Assessment ?140 bpm, Mod Var, -Decels, -Accels ?Toco: No ctx graphed ? ?MAU Course  ?No results found for this or any previous visit (from the past 24 hour(s)). ?No results found. ? ?MDM ?PE ?Labs: None ?EFM ? ?Assessment and Plan  ?21 year old D7O2423  ?SIUP at 26.2 weeks ?Cat  I FT ?Lower Back Pain ?DFM-Resolved ? ?-POC reviewed ?-Exam performed and findings discussed. ?-UA ordered ?-Patient offered and declined pain medication. ?-Will give heating pad.  ?-Reassured that back pain is normal during pregnancy. Discussed relief measures including tylenol, rest, hydration, and heat/cold compress.  ?-Informed that vaginal discharge is common during pregnancy and can be amplified by recent swim outing and/or sexual activity.  ?-Encouraged to monitor symptoms. ?-Instructed to keep next appt as scheduled.  ?-Patient without further questions.  ?-Discharged to home in stable condition. ? ?Cherre Robins MSN, CNM ?11/18/2021, 12:31 AM  ? ?

## 2021-11-26 ENCOUNTER — Encounter: Payer: Self-pay | Admitting: Obstetrics

## 2021-12-04 ENCOUNTER — Encounter: Payer: Self-pay | Admitting: Obstetrics and Gynecology

## 2021-12-04 ENCOUNTER — Ambulatory Visit (INDEPENDENT_AMBULATORY_CARE_PROVIDER_SITE_OTHER): Payer: Medicaid Other | Admitting: Obstetrics and Gynecology

## 2021-12-04 ENCOUNTER — Other Ambulatory Visit: Payer: Medicaid Other

## 2021-12-04 VITALS — BP 108/69 | HR 71 | Wt 194.2 lb

## 2021-12-04 DIAGNOSIS — Z23 Encounter for immunization: Secondary | ICD-10-CM

## 2021-12-04 DIAGNOSIS — Z3A28 28 weeks gestation of pregnancy: Secondary | ICD-10-CM | POA: Diagnosis not present

## 2021-12-04 DIAGNOSIS — Z3483 Encounter for supervision of other normal pregnancy, third trimester: Secondary | ICD-10-CM | POA: Diagnosis not present

## 2021-12-04 DIAGNOSIS — N96 Recurrent pregnancy loss: Secondary | ICD-10-CM

## 2021-12-04 DIAGNOSIS — O285 Abnormal chromosomal and genetic finding on antenatal screening of mother: Secondary | ICD-10-CM

## 2021-12-04 NOTE — Progress Notes (Signed)
? ?  PRENATAL VISIT NOTE ? ?Subjective:  ?Donna Simon is a 21 y.o. G5P1031 at [redacted]w[redacted]d being seen today for ongoing prenatal care.  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 ? ?Patient doing well with no acute concerns today. She reports no complaints.  Contractions: Irritability. Vag. Bleeding: None.  Movement: Present. Denies leaking of fluid.  ? ?Discussed late third trimester travel to [place], pt advised to be careful traveling at or beyond 34 weeks. ? ?The following portions of the patient's history were reviewed and updated as appropriate: allergies, current medications, past family history, past medical history, past social history, past surgical history and problem list. Problem list updated. ? ?Objective:  ? ?Vitals:  ? 12/04/21 0831  ?BP: 108/69  ?Pulse: 71  ?Weight: 88.1 kg  ? ? ?Fetal Status: Fetal Heart Rate (bpm): 146 Fundal Height: 27 cm Movement: Present    ? ?General:  Alert, oriented and cooperative. Patient is in no acute distress.  ?Skin: Skin is warm and dry. No rash noted.   ?Cardiovascular: Normal heart rate noted  ?Respiratory: Normal respiratory effort, no problems with respiration noted  ?Abdomen: Soft, gravid, appropriate for gestational age.  Pain/Pressure: Absent     ?Pelvic: Cervical exam deferred        ?Extremities: Normal range of motion.  Edema: Trace  ?Mental Status:  Normal mood and affect. Normal behavior. Normal judgment and thought content.  ? ?Assessment and Plan:  ?Pregnancy: [id number] at [redacted]w[redacted]d ? ?1. Encounter for supervision of other normal pregnancy in third trimester ?Continue routine prenatal ?- Tdap vaccine greater than or equal to 7yo IM ? ?2. History of multiple miscarriages ?Pt currently stable ? ?3. 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 ?4. [redacted] weeks gestation of pregnancy ? ? ?Preterm labor symptoms and general obstetric precautions including but not limited to vaginal bleeding, contractions, leaking of fluid and fetal movement were reviewed in detail with the patient. ? ?Please refer to After Visit Summary for other counseling recommendations.  ? ?Return in about 2 weeks (around 12/18/2021) for virtual, [place]. ? ? ?Lynnda Shields, MD ?Faculty Attending ?[facility] ?  ?

## 2021-12-04 NOTE — Addendum Note (Signed)
Addended by: Flonnie Hailstone on: 12/04/2021 10:50 AM ? ? Modules accepted: Orders ? ?

## 2021-12-05 LAB — GLUCOSE TOLERANCE, 2 HOURS W/ 1HR
Glucose, 1 hour: 86 mg/dL (ref 70–179)
Glucose, 2 hour: 77 mg/dL (ref 70–152)
Glucose, Fasting: 77 mg/dL (ref 70–91)

## 2021-12-05 LAB — CBC
Hematocrit: 31.5 % — ABNORMAL LOW (ref 34.0–46.6)
Hemoglobin: 10.7 g/dL — ABNORMAL LOW (ref 11.1–15.9)
MCH: 32.2 pg (ref 26.6–33.0)
MCHC: 34 g/dL (ref 31.5–35.7)
MCV: 95 fL (ref 79–97)
Platelets: 212 10*3/uL (ref 150–450)
RBC: 3.32 x10E6/uL — ABNORMAL LOW (ref 3.77–5.28)
RDW: 13.6 % (ref 11.7–15.4)
WBC: 12.3 10*3/uL — ABNORMAL HIGH (ref 3.4–10.8)

## 2021-12-05 LAB — HIV ANTIBODY (ROUTINE TESTING W REFLEX): HIV Screen 4th Generation wRfx: NONREACTIVE

## 2021-12-05 LAB — RPR: RPR Ser Ql: NONREACTIVE

## 2021-12-06 ENCOUNTER — Other Ambulatory Visit: Payer: Self-pay

## 2021-12-06 ENCOUNTER — Inpatient Hospital Stay: Payer: Medicaid Other | Attending: Genetic Counselor | Admitting: Genetic Counselor

## 2021-12-06 ENCOUNTER — Encounter: Payer: Self-pay | Admitting: Genetic Counselor

## 2021-12-06 ENCOUNTER — Inpatient Hospital Stay: Payer: Medicaid Other

## 2021-12-06 DIAGNOSIS — O285 Abnormal chromosomal and genetic finding on antenatal screening of mother: Secondary | ICD-10-CM

## 2021-12-06 NOTE — Progress Notes (Signed)
REFERRING PROVIDER: ?Lynnda Shields, MD ? ?PRIMARY PROVIDER:  ?Patient, No Pcp Per (Inactive) ? ?PRIMARY REASON FOR VISIT:  ?1. 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 ?HISTORY OF PRESENT ILLNESS:   ?Donna Simon, a 21 y.o. female, was seen for a [facility] cancer genetics consultation at the request of Dr. 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  Donna Simon presents to clinic today to discuss the possibility of a hereditary predisposition to cancer, to discuss genetic testing, and to further clarify her future cancer risks, as well as potential cancer risks for family members.  ? ?Donna Simon had abnormal NIPS that was followed by an amniocentesis. 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 Donna Simon had follow-up parental genetic testing and was found to carry the same deletion of chromosome 7. She is here today to have hereditary cancer genetic testing for the PMS2 gene.  ? ?CANCER HISTORY:  ?Donna Simon is a 21 y.o. female with no personal history of cancer.   ? ?RISK FACTORS:  ?First live birth at age 29.  ?OCP use for approximately 0 years.  ?Ovaries intact: yes.  ?Uterus intact: yes.  ?Menopausal status: premenopausal.  ?HRT use: 0 years. ?Colonoscopy: no ?Mammogram within the last year: yes. ?Any excessive radiation exposure in the past: no ? ?No past medical history on file. ? ?Past Surgical History:  ?Procedure Laterality Date  ? APPENDECTOMY Right 2009  ? ? ?Social History  ? ?Socioeconomic History  ? Marital status: Single  ?  Spouse name: Not on file  ? Number of children: Not on file  ? Years of education: Not on file  ? Highest education level: Not on file  ?Occupational History  ? Not on file  ?Tobacco Use  ? Smoking status: Never  ? Smokeless tobacco: Never  ?Vaping Use  ? Vaping Use: Never used  ?Substance and Sexual Activity  ? Alcohol use: Not  Currently  ? Drug use: Never  ? Sexual activity: Yes  ?Other Topics Concern  ? Not on file  ?Social History Narrative  ? Not on file  ? ?Social Determinants of Health  ? ?Financial Resource Strain: Not on file  ?Food Insecurity: Not on file  ?Transportation Needs: Not on file  ?Physical Activity: Not on file  ?Stress: Not on file  ?Social Connections: Not on file  ?  ? ?FAMILY HISTORY:  ?We obtained a detailed, 4-generation family history.  Significant diagnoses are listed below: ?Family History  ?Problem Relation Age of Onset  ? Hypertension Mother   ? Obesity Mother   ? Miscarriages / [place] Mother   ? Obesity Father   ? Breast cancer Paternal Aunt   ?     dx. 23s, metastatic  ? Breast cancer Paternal Uncle   ?     dx. 25s  ? Stomach cancer Maternal Great-grandmother   ? ? ? ? ?Donna Simon maternal great grandmother was diagnosed with stomach cancer at an unknown age, she is deceased. Donna Simon has 2 paternal aunts with a history of breast cancer. One aunt was diagnosed with breast cancer in her 69s and died at age 52 due to metastatic breast cancer. The second aunt was diagnosed with breast cancer in her 69s, she had a mastectomy as part of her treatment. Donna Simon is unaware of previous family history of genetic testing  for hereditary cancer risks. There is no reported Ashkenazi Jewish ancestry.  ? ?GENETIC COUNSELING ASSESSMENT: Donna Simon is a 21 y.o. female with an abnormal microarray showing a possible deletion of the PMS2 gene. We, therefore, discussed and recommended the following at today's visit.  ? ?DISCUSSION:  ?We discussed the cancer risks and management recommendations for individuals with a PMS2 gene mutation.  ? ?Clinical Information ?Pathogenic variants in the PMS2 gene are associated with Lynch syndrome. ? ?The cancers associated with PMS2 are: ?Colorectal cancer, 8.7-20% risk (average age of diagnosis is 73-66) ?Endometrial cancer, 13-26% risk (average age of diagnosis is  49-50) ?Ovarian cancer, up to a 3% risk (average age of diagnosis is 44-59) ?Renal pelvis and/or ureter, up to a 3.7% risk (average age of diagnosis is unknown) ?Breast, prostate, pancreatic, gastric, small bowel, bladder, biliary tract, and brain cancer risk may be elevated ? ?Management Recommendations: ? ?Colorectal Cancer Screening: ?High quality colonoscopy at age 14-35 or 2-5 years prior to the earliest colon cancer if it is diagnosed before age 32 and repeat every 1-3 years ?Studies have demonstrated that the use of daily aspirin decreases the colorectal cancer risk in patients with Lynch Syndrome. Ongoing studies are investigating the optimal dose and duration of use of aspirin for colon cancer prevention in Lynch Syndrome. The decision to use aspirin should be made on an individualized basis including a discussion of dose, benefits, and adverse effects.   ? ?Endometrial Cancer Screening/Risk Reduction: ?Women should report any abnormal uterine bleeding or postmenopausal bleeding. The evaluation of these symptoms should include an endometrial biopsy. ?A hysterectomy may be considered. The timing should be individualized based on whether childbearing is complete, comorbidities, and family history. ?Endometrial cancer screening does not have a proven benefit in women with Lynch Syndrome. However, endometrial biopsy is highly sensitive and specific as a diagnostic procedure. Screening via endometrial biopsy every 1-2 years starting at age 88-35 can be considered. ?Transvaginal ultrasounds may be considered in postmenopausal women at their clinician's discretion. ? ?Ovarian Cancer Screening/Risk Reduction: ?There is currently insufficient evidence to make a specific recommendation for a prophylactic bilateral salpingo-oophorectomy (BSO), or having the ovaries and fallopian tubes removed, for individuals who carry a PMS2 pathogenic variant. ?Current data does not support routine ovarian screening for Lynch  syndrome, therefore it may be considered at the clinician's discretion. Screening includes transvaginal ultrasounds and a blood test to measure CA-125 levels every 6-12 months. ?If there is a family history of ovarian cancer, have a discussion with your physician about the benefits and limitations of screening and risk reduction strategies.  ?Women should be aware of symptoms that might be associated with the development of ovarian cancer including pelvic or abdominal pain, bloating, increased abdominal girth, difficulty eating, early satiety, or urinary frequency or urgency. Symptoms that persist for several weeks and are a change from a woman's baseline should prompt evaluation by her physician. ? ?Urothelial Cancer Screening: ?Annual urinalysis starting at age 33-35 may be considered in selected individuals such as those with a family history of urothelial cancer (renal pelvis, ureter, and/or bladder). ? ?Gastric and Small Bowel Cancer Screening: ?While there is no clear evidence to support screening for gastric, duodenal, and small bowel cancer for Lynch Syndrome, select individuals, families, or those of Asian descent may consider esophagogastroduodenoscopy (EGD) with random biopsy of the proximal and distal stomach beginning at 10 and surveillance EDG every 3-5 years ?  ?Pancreatic Cancer Screening: ?Avoid smoking, heavy alcohol use, and obesity. ?It has been  suggested that pancreatic cancer screening be limited to those with a family history of pancreatic cancer (first- or second-degree relative). Ideally, screening should be performed in experienced centers utilizing a multidisciplinary approach under research conditions. Recommended screening include annual endoscopic ultrasound (preferred) and/or MRI of the pancreas starting at age 69 or 58 years younger than the earliest age diagnosis in the family. Annual concurrent CA19-9 testing should also be considered. ? ?Prostate Cancer Screening: ?Consider  beginning annual PSA blood test and digital rectal exams at age 87. ? ?Breast Cancer Screening/Risk Reduction: ?Breast awareness beginning at age 66 ?Monthly self-breast examination beginning at age 43 ?Annual clinical breast e

## 2021-12-18 ENCOUNTER — Telehealth (INDEPENDENT_AMBULATORY_CARE_PROVIDER_SITE_OTHER): Payer: Medicaid Other | Admitting: Obstetrics & Gynecology

## 2021-12-18 VITALS — BP 118/71 | HR 94 | Wt 197.0 lb

## 2021-12-18 DIAGNOSIS — Z3483 Encounter for supervision of other normal pregnancy, third trimester: Secondary | ICD-10-CM

## 2021-12-18 DIAGNOSIS — Z3A30 30 weeks gestation of pregnancy: Secondary | ICD-10-CM

## 2021-12-18 NOTE — Progress Notes (Signed)
I connected with Donna Simon 12/18/21 at  2:30 PM EDT by: MyChart video and verified that I am speaking with the correct person using two identifiers.  Patient is located at home and provider is located at [place].   ?  ?I discussed the limitations, risks, security and privacy concerns of performing an evaluation and management service by MyChart video and the availability of in person appointments. I also discussed with the patient that there may be a patient responsible charge related to this service. By engaging in this virtual visit, you consent to the provision of healthcare.  Additionally, you authorize for your insurance to be billed for the services provided during this visit.  The patient expressed understanding and agreed to proceed. ? ?The following staff members participated in the virtual visit:  Adam Phenix, MD ? ? ? ? ?PRENATAL VISIT NOTE ? ?Subjective:  ?Donna Simon is a 21 y.o. [id number] at [redacted]w[redacted]d  for virtual visit for ongoing prenatal care.  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 ? ?Patient reports no complaints.  Contractions: Irritability. Vag. Bleeding: None.  Movement: Present. Denies leaking of fluid.  ? ?The following portions of the patient's history were reviewed and updated as appropriate: allergies, current medications, past family history, past medical history, past social history, past surgical history and problem list.  ? ?Objective:  ? ?Vitals:  ? 12/18/21 1412  ?BP: 118/71  ?Pulse: 94  ?Weight: 197 lb (89.4 kg)  ? Self-Obtained ? ?Fetal Status:     Movement: Present    ? ?Assessment and Plan:  ?Pregnancy: [id number] at [redacted]w[redacted]d ?1. Encounter for supervision of other normal pregnancy in third trimester ?Doing well  ? ?Preterm labor  symptoms and general obstetric precautions including but not limited to vaginal bleeding, contractions, leaking of fluid and fetal movement were reviewed in detail with the patient. ? ?2 week return ? ?No future appointments. ? ? ?Time spent on virtual visit: 12 minutes ? ?Scheryl Darter, MD Patient ID: Donna Simon, female   DOB: April 21, 2001, 21 y.o.   MRN: [id number] ? ?

## 2021-12-23 IMAGING — US US OB < 14 WEEKS - US OB TV
1 series · 15 of 28 positions shown · non-contrast
Comparison: Pelvic ultrasound dated 11/21/2020.

CLINICAL DATA: 19-year-old pregnant female with cramping. Recent
spontaneous miscarriage.

EXAM:
OBSTETRIC <14 WK US AND TRANSVAGINAL OB US
TECHNIQUE: Both transabdominal and transvaginal ultrasound examinations were
performed for complete evaluation of the gestation as well as the
maternal uterus, adnexal regions, and pelvic cul-de-sac.
Transvaginal technique was performed to assess early pregnancy.

[Series 1: us ob < 14 weeks - us ob tv · 15 of 35 slices shown]
[im 1/35]
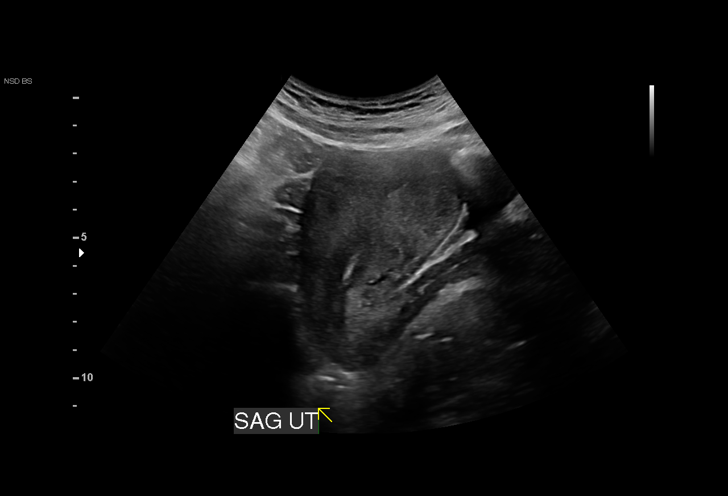
[im 3/35]
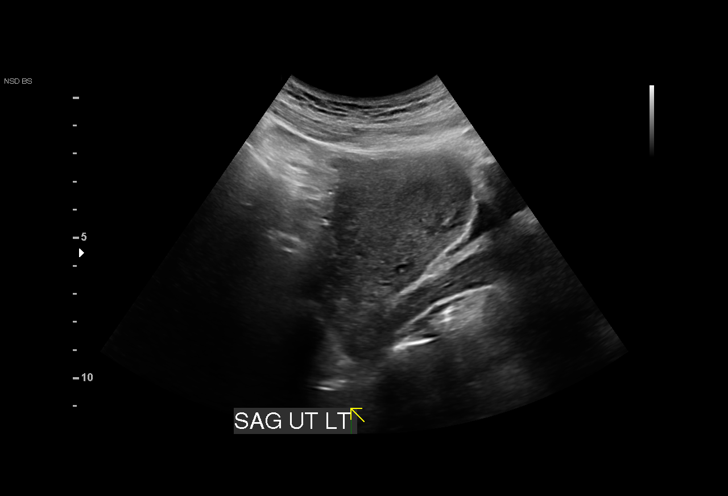
[im 6/35]
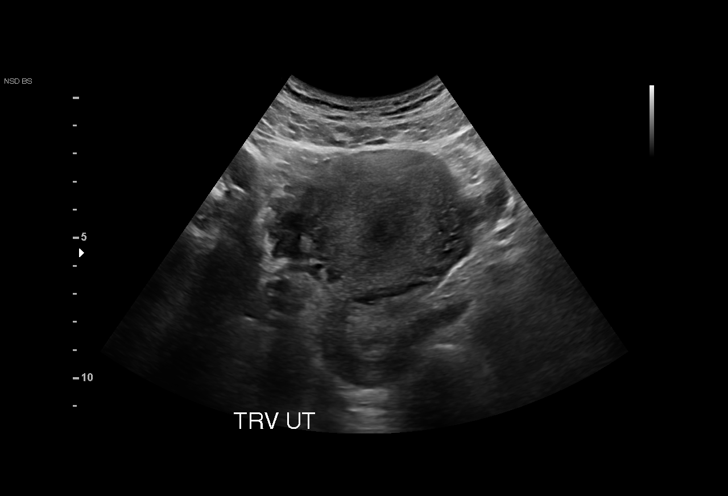
[im 8/35]
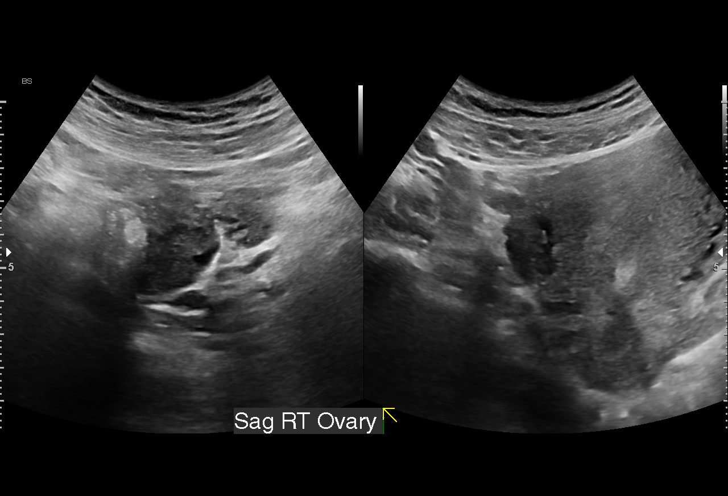
[im 11/35]
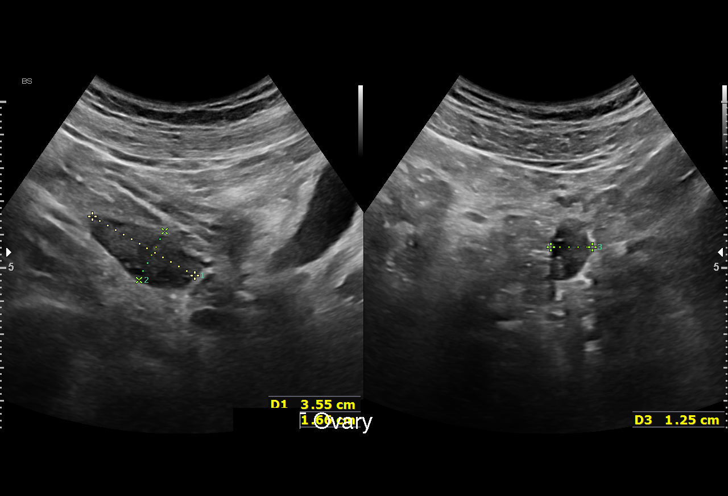
[im 13/35]
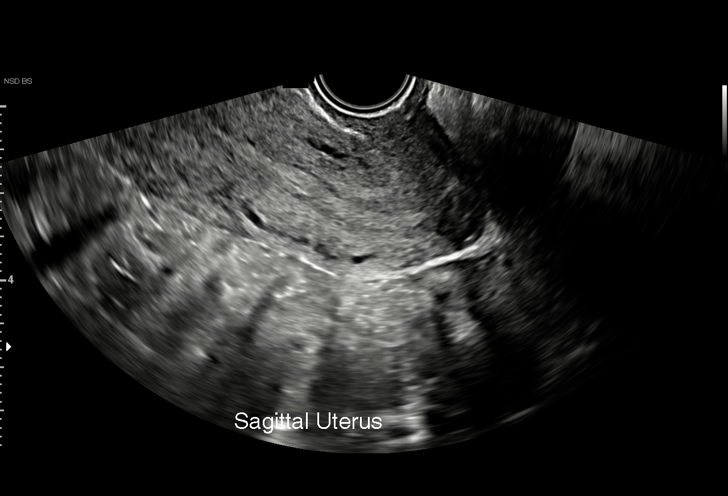
[im 16/35]
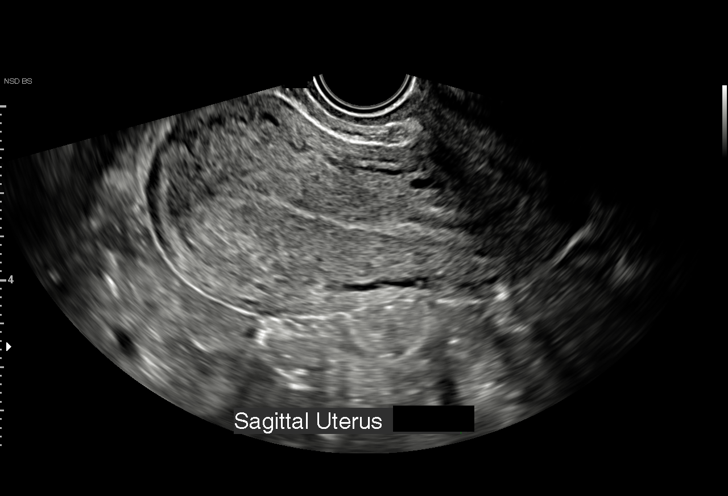
[im 18/35]
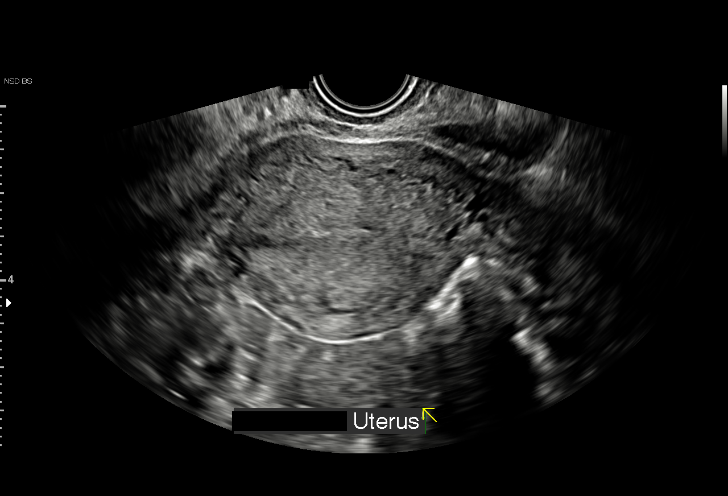
[im 19/35]
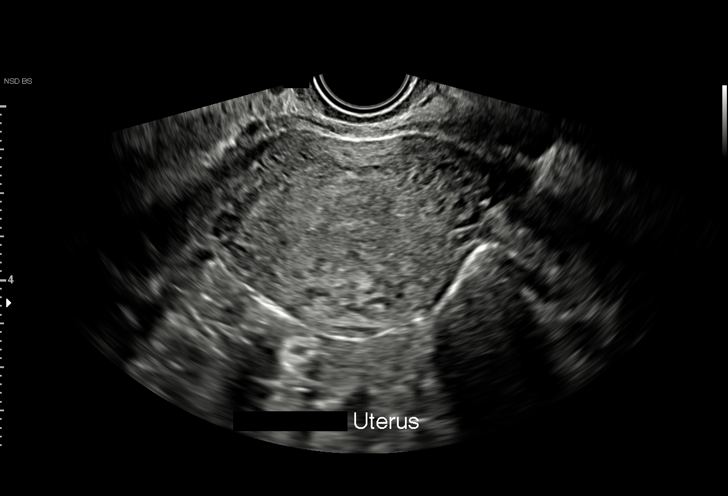
[im 22/35]
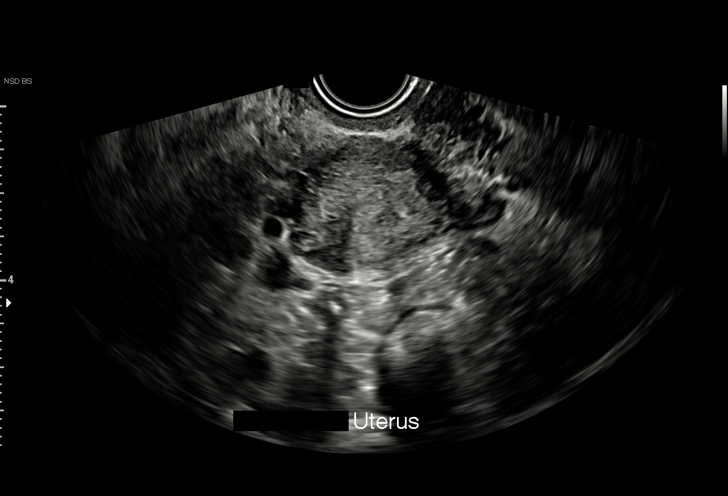
[im 24/35]
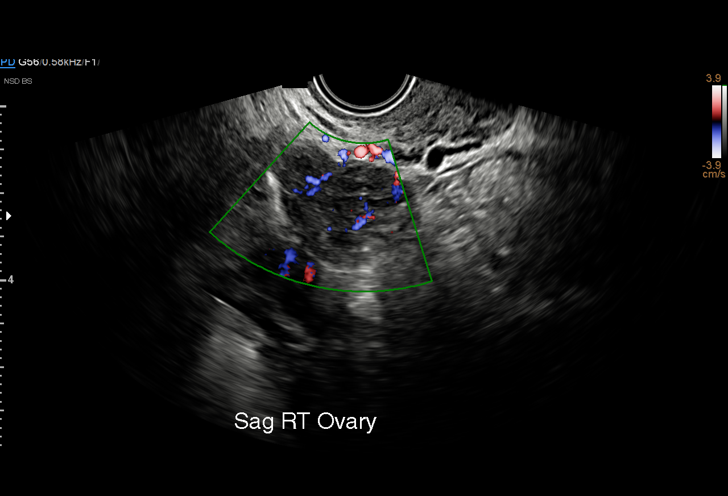
[im 27/35]
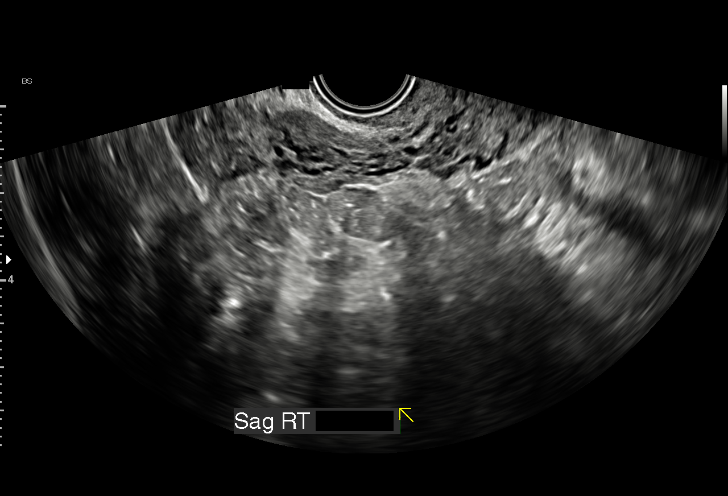
[im 29/35]
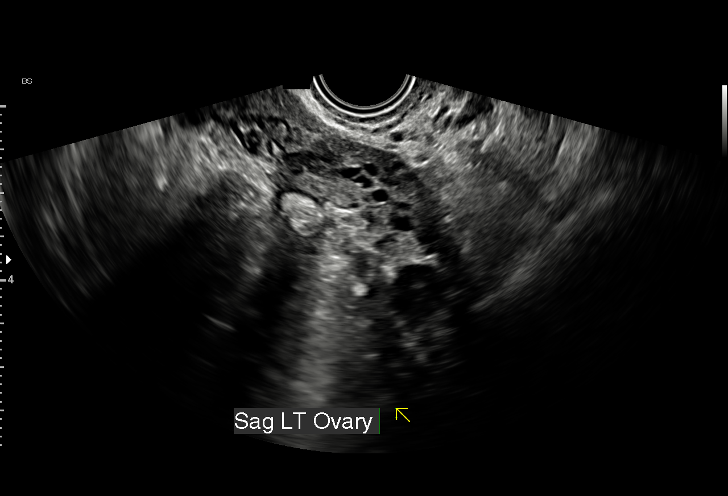
[im 32/35]
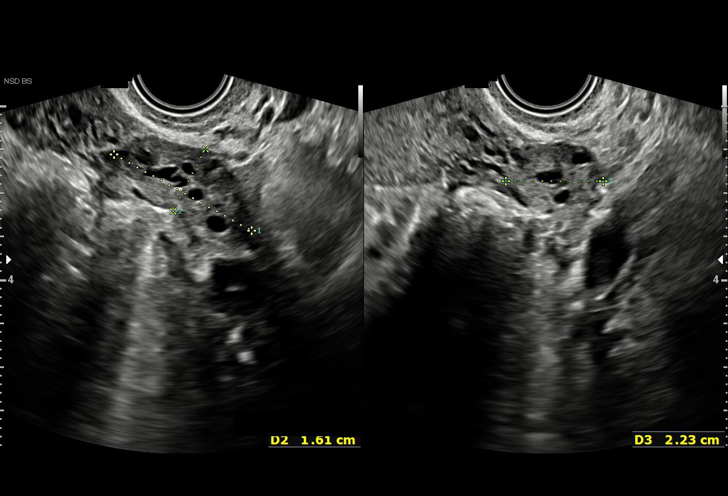
[im 35/35]
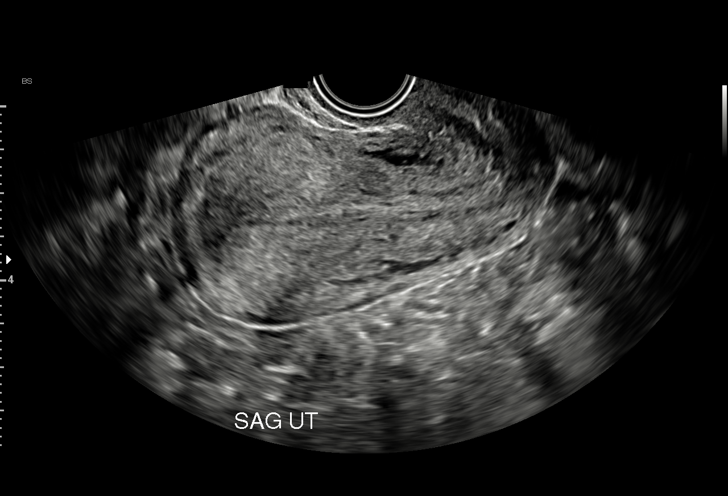

[15 of 28 positions shown; findings below may reference images not displayed]

FINDINGS: The uterus is anteverted and appears unremarkable.

The endometrium is unremarkable and measures approximately 2 mm in
thickness. No intrauterine pregnancy identified.

The ovaries are unremarkable.

No significant free fluid within the pelvis.
IMPRESSION: No intrauterine pregnancy identified and no adnexal masses noted.
Findings consistent with pregnancy of unknown location and
differential diagnosis include: Recent spontaneous miscarriage,
early IUP, or an occult ectopic pregnancy. Correlation with clinical
exam and serial HCG levels and follow-up with ultrasound as
clinically indicated recommended.

## 2022-01-02 ENCOUNTER — Telehealth (INDEPENDENT_AMBULATORY_CARE_PROVIDER_SITE_OTHER): Payer: Medicaid Other | Admitting: Women's Health

## 2022-01-02 VITALS — BP 112/64 | HR 89 | Wt 198.0 lb

## 2022-01-02 DIAGNOSIS — Z3A32 32 weeks gestation of pregnancy: Secondary | ICD-10-CM

## 2022-01-02 DIAGNOSIS — Z3483 Encounter for supervision of other normal pregnancy, third trimester: Secondary | ICD-10-CM

## 2022-01-02 DIAGNOSIS — O285 Abnormal chromosomal and genetic finding on antenatal screening of mother: Secondary | ICD-10-CM

## 2022-01-02 NOTE — Progress Notes (Signed)
Reports increased UC's when breastfeeding 21 year old.

## 2022-01-02 NOTE — Patient Instructions (Addendum)
Maternity Assessment Unit (MAU)  The Maternity Assessment Unit (MAU) is located at the [facility] at [facility]. The address is: [address], [place], [place], [place] [address]. Please see map below for additional directions.    The Maternity Assessment Unit is designed to help you during your pregnancy, and for up to 6 weeks after delivery, with any pregnancy- or postpartum-related emergencies, if you think you are in labor, or if your water has broken. For example, if you experience nausea and vomiting, vaginal bleeding, severe abdominal or pelvic pain, elevated blood pressure or other problems related to your pregnancy or postpartum time, please come to the Maternity Assessment Unit for assistance.                        Safe Medications in Pregnancy    Acne: Benzoyl Peroxide Salicylic Acid  Backache/Headache: Tylenol: 2 regular strength every 4 hours OR              2 Extra strength every 6 hours  Colds/Coughs/Allergies: Benadryl (alcohol free) 25 mg every 6 hours as needed Breath right strips Claritin Cepacol throat lozenges Chloraseptic throat spray Cold-Eeze- up to three times per day Cough drops, alcohol free Flonase (by prescription only) Guaifenesin Mucinex Robitussin DM (plain only, alcohol free) Saline nasal spray/drops Sudafed (pseudoephedrine) & Actifed ** use only after [redacted] weeks gestation and if you do not have high blood pressure Tylenol Vicks Vaporub Zinc lozenges Zyrtec   Constipation: Colace - DAILY Ducolax suppositories Fleet enema Glycerin suppositories Metamucil - DAILY Milk of magnesia Miralax Senokot Smooth move tea  Diarrhea: Kaopectate Imodium A-D  *NO pepto Bismol  Hemorrhoids: Anusol Anusol HC Preparation H Tucks  Indigestion: Tums Maalox Mylanta Zantac  Pepcid  Insomnia: Benadryl (alcohol free) 25mg  every 6 hours as needed Tylenol PM Unisom, no Gelcaps  Leg  Cramps: Tums MagGel  Nausea/Vomiting:  Bonine Dramamine Emetrol Ginger extract Sea bands Meclizine  Nausea medication to take during pregnancy:  Unisom (doxylamine succinate 25 mg tablets) Take one tablet daily at bedtime. If symptoms are not adequately controlled, the dose can be increased to a maximum recommended dose of two tablets daily (1/2 tablet in the morning, 1/2 tablet mid-afternoon and one at bedtime). Vitamin B6 100mg  tablets. Take one tablet twice a day (up to 200 mg per day).  Skin Rashes: Aveeno products Benadryl cream or 25mg  every 6 hours as needed Calamine Lotion 1% cortisone cream  Yeast infection: Gyne-lotrimin 7 Monistat 7   **If taking multiple medications, please check labels to avoid duplicating the same active ingredients **take medication as directed on the label ** Do not exceed 4000 mg of tylenol in 24 hours **Do not take medications that contain aspirin or ibuprofen        You have constipation which is hard stools that are difficult to pass. It is important to have regular bowel movements every 1-3 days that are soft and easy to pass. Hard stools increase your risk of hemorrhoids and are very uncomfortable.   To prevent constipation you can increase the amount of fiber in your diet. Examples of foods with fiber are leafy greens, whole grain breads, oatmeal and other grains.  It is also important to drink at least eight 8oz glass of water everyday.   If you have not has a bowel movement in 4-5 days you made need to clean out your bowel.  This will have establish normal movement through your bowel.  Miralax Clean out Take 8 capfuls of miralax in 64 oz of gatorade. You can use any fluid that appeals to you (gatorade, water, juice) Continue to drink at least eight 8 oz glasses of water throughout the day You can repeat with another 8 capfuls of miralax in 64 oz of gatorade if you are not having a large amount of stools You will need to be at  home and close to a bathroom for about 8 hours when you do the above as you may need to go to the bathroom frequently.   After you are cleaned out: - Start Colace100mg  twice daily - Start Miralax once daily - Start a daily fiber supplement like metamucil or citrucel - You can safely use enemas in pregnancy  - if you are having diarrhea you can reduce to Colace once a day or miralax every other day or a 1/2 capful daily.

## 2022-01-02 NOTE — Progress Notes (Signed)
I connected with Donna Simon 01/02/22 at  2:50 PM EDT by: MyChart video and verified that I am speaking with the correct person using two identifiers.  Patient is located at home and provider is located at [facility].     I discussed the limitations, risks, security and privacy concerns of performing an evaluation and management service by MyChart video and the availability of in person appointments. I also discussed with the patient that there may be a patient responsible charge related to this service. By engaging in this virtual visit, you consent to the provision of healthcare.  Additionally, you authorize for your insurance to be billed for the services provided during this visit.  The patient expressed understanding and agreed to proceed.  The following staff members participated in the virtual visit:  Donia Ast    PRENATAL VISIT NOTE  Subjective:  Donna Simon is a 21 y.o. [id number] at [redacted]w[redacted]d  for virtual visit for ongoing prenatal care.  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  Patient reports no complaints.  Contractions: Irritability. Vag. Bleeding: None.  Movement: Present. Denies leaking of fluid.   The following portions of the patient's history were reviewed and updated as appropriate: allergies, current medications, past family history, past medical history, past social history, past surgical history and problem list.   Objective:   Vitals:   01/02/22 1317  BP: 112/64  Pulse: 89  Weight: 198 lb (89.8 kg)   Self-Obtained  Fetal Status:     Movement: Present     Assessment and Plan:  Pregnancy: G5P1031 at [redacted]w[redacted]d  1. Encounter for supervision of other normal pregnancy in third trimester -interested in waterbirth, next appt  with midwife -discussed travel in pregnancy, pt advised to stay well-hydrated, no sit for extended periods of time, frequent movement, especially if flying/driving, bring copy of medical records and locate closest hospital with L&D floor to present to with pregnancy related emergency -discussed constipation and relief measures     12/04/2021    8:35 AM 07/16/2021    9:05 AM  PHQ9 SCORE ONLY  PHQ-9 Total Score 1 0      12/04/2021    8:36 AM 07/16/2021    9:05 AM  GAD 7 : Generalized Anxiety Score  Nervous, Anxious, on Edge 0 0  Control/stop worrying 0 0  Worry too much - different things 0 0  Trouble relaxing 0 0  Restless 0 0  Easily annoyed or irritable 0 0  Afraid - awful might happen 0 0  Total GAD 7 Score 0 0   2. 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 [redacted] weeks gestation of pregnancy  Preterm labor symptoms and general obstetric precautions including but not limited to vaginal bleeding, contractions, leaking of fluid and fetal movement were reviewed in detail with the patient. I discussed the assessment and treatment plan with the patient. The patient was provided an opportunity to ask questions and all were answered. The patient agreed with the plan and demonstrated an understanding of the instructions. The patient was advised to call back or seek an in-person office evaluation/go to MAU at [facility] for any urgent or concerning symptoms.  Return in about 2 weeks (around 01/16/2022) for in-person/MIDWIFE  only/discuss WB.  Future Appointments  Date Time Provider Department Center  01/02/2022  2:50 PM Daaron Dimarco, Odie Sera, NP [facility] None     Time spent on virtual visit: 12 minutes  Donna Ponto, NP

## 2022-01-16 ENCOUNTER — Ambulatory Visit (INDEPENDENT_AMBULATORY_CARE_PROVIDER_SITE_OTHER): Payer: Medicaid Other | Admitting: Obstetrics

## 2022-01-16 VITALS — BP 108/68 | HR 68 | Wt 203.0 lb

## 2022-01-16 DIAGNOSIS — Z3A34 34 weeks gestation of pregnancy: Secondary | ICD-10-CM

## 2022-01-16 DIAGNOSIS — Z3483 Encounter for supervision of other normal pregnancy, third trimester: Secondary | ICD-10-CM

## 2022-01-16 NOTE — Progress Notes (Signed)
Pt presents for [name].  Has c/o muscle cramps in legs.

## 2022-01-17 ENCOUNTER — Encounter: Payer: Self-pay | Admitting: Obstetrics

## 2022-01-17 NOTE — Progress Notes (Signed)
Subjective:  Donna Simon is a 21 y.o. G5P1031 at [redacted]w[redacted]d being seen today for ongoing prenatal care.  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  Patient reports no complaints.  Contractions: Irritability. Vag. Bleeding: None.  Movement: Present. Denies leaking of fluid.   The following portions of the patient's history were reviewed and updated as appropriate: allergies, current medications, past family history, past medical history, past social history, past surgical history and problem list. Problem list updated.  Objective:   Vitals:   01/16/22 1456  BP: 108/68  Pulse: 68  Weight: 203 lb (92.1 kg)    Fetal Status: Fetal Heart Rate (bpm): 140   Movement: Present     General:  Alert, oriented and cooperative. Patient is in no acute distress.  Skin: Skin is warm and dry. No rash noted.   Cardiovascular: Normal heart rate noted  Respiratory: Normal respiratory effort, no problems with respiration noted  Abdomen: Soft, gravid, appropriate for gestational age. Pain/Pressure: Present     Pelvic:  Cervical exam deferred        Extremities: Normal range of motion.  Edema: Trace  Mental Status: Normal mood and affect. Normal behavior. Normal judgment and thought content.   Urinalysis:      Assessment and Plan:  Pregnancy: G5P1031 at [redacted]w[redacted]d  1. Encounter for supervision of other normal pregnancy in third trimester   Preterm labor symptoms and general obstetric precautions including but not limited to vaginal bleeding, contractions, leaking of fluid and fetal movement were reviewed in detail with the patient. Please refer to After Visit Summary for other counseling recommendations.   Return in about 2 weeks (around 01/30/2022) for  [name].   Brock Bad, MD  01/17/22

## 2022-01-24 ENCOUNTER — Telehealth: Payer: Self-pay | Admitting: Emergency Medicine

## 2022-01-24 NOTE — Telephone Encounter (Signed)
Pt called to clinic reporting increased vaginal discharge.Denies odor, itching, irritation. Denies vaginal bleeding, cramping or contractions. Reports + fetal movement.  Pt instructed to monitor, given precautions for MAU.

## 2022-01-30 ENCOUNTER — Ambulatory Visit (INDEPENDENT_AMBULATORY_CARE_PROVIDER_SITE_OTHER): Payer: Medicaid Other | Admitting: Student

## 2022-01-30 ENCOUNTER — Other Ambulatory Visit (HOSPITAL_COMMUNITY)
Admission: RE | Admit: 2022-01-30 | Discharge: 2022-01-30 | Disposition: A | Discharge status: Home / Self Care | Payer: Medicaid Other | Source: Ambulatory Visit | Attending: Student | Admitting: Student

## 2022-01-30 VITALS — BP 114/71 | HR 70 | Wt 204.6 lb

## 2022-01-30 DIAGNOSIS — Z3A36 36 weeks gestation of pregnancy: Secondary | ICD-10-CM

## 2022-01-30 DIAGNOSIS — Z3483 Encounter for supervision of other normal pregnancy, third trimester: Secondary | ICD-10-CM | POA: Diagnosis present

## 2022-01-30 NOTE — Progress Notes (Signed)
   PRENATAL VISIT NOTE  Subjective:  Donna Simon is a 21 y.o. G5P1031 at [redacted]w[redacted]d being seen today for ongoing prenatal care.  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  Patient reports occasional contractions.  Contractions: Irregular. Vag. Bleeding: None.  Movement: Present. Denies leaking of fluid.   The following portions of the patient's history were reviewed and updated as appropriate: allergies, current medications, past family history, past medical history, past social history, past surgical history and problem list.   Objective:   Vitals:   01/30/22 1510  BP: 114/71  Pulse: 70  Weight: 204 lb 9.6 oz (92.8 kg)    Fetal Status: Fetal Heart Rate (bpm): 146 Fundal Height: 35 cm Movement: Present     General:  Alert, oriented and cooperative. Patient is in no acute distress.  Skin: Skin is warm and dry. No rash noted.   Cardiovascular: Normal heart rate noted  Respiratory: Normal respiratory effort, no problems with respiration noted  Abdomen: Soft, gravid, appropriate for gestational age.  Pain/Pressure: Present     Pelvic: Cervical exam deferred        Extremities: Normal range of motion.  Edema: Trace  Mental Status: Normal mood and affect. Normal behavior. Normal judgment and thought content.   Assessment and Plan:  Pregnancy: G5P1031 at [redacted]w[redacted]d 1. Encounter for supervision of other normal pregnancy in third trimester - Doing well, feeling frequent baby movement - Culture, beta strep (group b only) - Cervicovaginal ancillary only( [place])  2. [redacted] weeks gestation of pregnancy - GBS/Culture Swabs collected  Preterm labor symptoms and general obstetric precautions including but not limited to vaginal bleeding, contractions,  leaking of fluid and fetal movement were reviewed in detail with the patient. Please refer to After Visit Summary for other counseling recommendations.   Return in about 1 week (around 02/06/2022) for LOB.  Future Appointments  Date Time Provider Department Center  02/06/2022  2:55 PM Marny Lowenstein, PA-C [facility] None  02/13/2022  2:50 PM Corlis Hove, NP [facility] None  02/19/2022  2:30 PM Leftwich-Kirby, Wilmer Floor, CNM [facility] None    Corlis Hove, NP

## 2022-01-30 NOTE — Progress Notes (Unsigned)
Pt presents for [name]. Reports contractions, desires cervical check. 36 week labs today.

## 2022-01-31 LAB — CERVICOVAGINAL ANCILLARY ONLY
Chlamydia: NEGATIVE
Comment: NEGATIVE
Comment: NORMAL
Neisseria Gonorrhea: NEGATIVE

## 2022-02-03 LAB — CULTURE, BETA STREP (GROUP B ONLY): Strep Gp B Culture: NEGATIVE

## 2022-02-06 ENCOUNTER — Encounter: Payer: Self-pay | Admitting: Medical

## 2022-02-06 ENCOUNTER — Ambulatory Visit (INDEPENDENT_AMBULATORY_CARE_PROVIDER_SITE_OTHER): Payer: Medicaid Other | Admitting: Medical

## 2022-02-06 VITALS — BP 114/72 | HR 79 | Wt 208.0 lb

## 2022-02-06 DIAGNOSIS — Z3A37 37 weeks gestation of pregnancy: Secondary | ICD-10-CM

## 2022-02-06 DIAGNOSIS — O285 Abnormal chromosomal and genetic finding on antenatal screening of mother: Secondary | ICD-10-CM

## 2022-02-06 DIAGNOSIS — Z3483 Encounter for supervision of other normal pregnancy, third trimester: Secondary | ICD-10-CM

## 2022-02-06 NOTE — Progress Notes (Signed)
   PRENATAL VISIT NOTE  Subjective:  Donna Simon is a 21 y.o. G5P1031 at [redacted]w[redacted]d being seen today for ongoing prenatal care.  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  Patient reports occasional contractions and increased pressure.  Contractions: Irregular. Vag. Bleeding: None.  Movement: Present. Denies leaking of fluid.   The following portions of the patient's history were reviewed and updated as appropriate: allergies, current medications, past family history, past medical history, past social history, past surgical history and problem list.   Objective:   Vitals:   02/06/22 1502  BP: 114/72  Pulse: 79  Weight: 208 lb (94.3 kg)    Fetal Status: Fetal Heart Rate (bpm): 140   Movement: Present     General:  Alert, oriented and cooperative. Patient is in no acute distress.  Skin: Skin is warm and dry. No rash noted.   Cardiovascular: Normal heart rate noted  Respiratory: Normal respiratory effort, no problems with respiration noted  Abdomen: Soft, gravid, appropriate for gestational age.  Pain/Pressure: Present     Pelvic: Cervical exam deferred        Extremities: Normal range of motion.  Edema: Trace  Mental Status: Normal mood and affect. Normal behavior. Normal judgment and thought content.   Assessment and Plan:  Pregnancy: G5P1031 at [redacted]w[redacted]d 1. Encounter for supervision of other normal pregnancy in third trimester - still planning WB, discussed consent will be signed at the hospital if still a candidate at admission - GBS and GC/CT negative at last visit   2. deletion of portion of chromosome 7, see notes 10/25/21  3. [redacted] weeks gestation of pregnancy  Term labor symptoms and general obstetric precautions including but not limited  to vaginal bleeding, contractions, leaking of fluid and fetal movement were reviewed in detail with the patient. Please refer to After Visit Summary for other counseling recommendations.   Return in about 1 week (around 02/13/2022) for LOB, In-Person, Midwife preferred.  Future Appointments  Date Time Provider Department Center  02/13/2022  2:50 PM Corlis Hove, NP [facility] None  02/19/2022  2:30 PM Leftwich-Kirby, Wilmer Floor, CNM [facility] None    Vonzella Nipple, PA-C

## 2022-02-06 NOTE — Progress Notes (Signed)
Pt is interested in signing water birth form. Did the class last year.

## 2022-02-13 ENCOUNTER — Ambulatory Visit (INDEPENDENT_AMBULATORY_CARE_PROVIDER_SITE_OTHER): Payer: Medicaid Other | Admitting: Student

## 2022-02-13 VITALS — BP 133/73 | HR 77 | Wt 208.6 lb

## 2022-02-13 DIAGNOSIS — Z3483 Encounter for supervision of other normal pregnancy, third trimester: Secondary | ICD-10-CM

## 2022-02-13 DIAGNOSIS — Z3A38 38 weeks gestation of pregnancy: Secondary | ICD-10-CM

## 2022-02-13 NOTE — Progress Notes (Signed)
   PRENATAL VISIT NOTE  Subjective:  Donna Simon is a 21 y.o. G5P1031 at [redacted]w[redacted]d being seen today for ongoing prenatal care.  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  Patient reports no complaints.  Contractions: Irregular. Vag. Bleeding: None.  Movement: Present. Denies leaking of fluid.   The following portions of the patient's history were reviewed and updated as appropriate: allergies, current medications, past family history, past medical history, past social history, past surgical history and problem list.   Objective:   Vitals:   02/13/22 1527  BP: 133/73  Pulse: 77  Weight: 208 lb 9.6 oz (94.6 kg)    Fetal Status: Fetal Heart Rate (bpm): 162 Fundal Height: 38 cm Movement: Present     General:  Alert, oriented and cooperative. Patient is in no acute distress.  Skin: Skin is warm and dry. No rash noted.   Cardiovascular: Normal heart rate noted  Respiratory: Normal respiratory effort, no problems with respiration noted  Abdomen: Soft, gravid, appropriate for gestational age.  Pain/Pressure: Present     Pelvic: Cervical exam performed in the presence of a chaperone Dilation: 1.5 Effacement (%): 70 Station: -3  Extremities: Normal range of motion.  Edema: Trace  Mental Status: Normal mood and affect. Normal behavior. Normal judgment and thought content.   Assessment and Plan:  Pregnancy: G5P1031 at [redacted]w[redacted]d 1. Encounter for supervision of other normal pregnancy in third trimester - Excited and ready for delivery!  - Desires waterbirth, next visit is with CNM  2. [redacted] weeks gestation of pregnancy   Term labor symptoms and general obstetric precautions including but not limited to vaginal bleeding, contractions, leaking of fluid and  fetal movement were reviewed in detail with the patient. Please refer to After Visit Summary for other counseling recommendations.   No follow-ups on file.  Future Appointments  Date Time Provider Department Center  02/19/2022  2:30 PM Leftwich-Kirby, Wilmer Floor, CNM [facility] None    Corlis Hove, NP

## 2022-02-13 NOTE — Progress Notes (Signed)
Pt presents for [name] visit. Pt requesting cervical check and membrane sweep today.

## 2022-02-14 ENCOUNTER — Encounter (HOSPITAL_COMMUNITY): Payer: Self-pay | Admitting: Obstetrics and Gynecology

## 2022-02-14 ENCOUNTER — Inpatient Hospital Stay (HOSPITAL_COMMUNITY)
Admission: AD | Admit: 2022-02-14 | Discharge: 2022-02-16 | DRG: 807 | Disposition: A | Discharge status: Home / Self Care | Payer: Medicaid Other | Attending: Family Medicine | Admitting: Family Medicine

## 2022-02-14 DIAGNOSIS — Z3A38 38 weeks gestation of pregnancy: Secondary | ICD-10-CM | POA: Diagnosis not present

## 2022-02-14 DIAGNOSIS — O26893 Other specified pregnancy related conditions, third trimester: Secondary | ICD-10-CM | POA: Diagnosis present

## 2022-02-14 DIAGNOSIS — Z975 Presence of (intrauterine) contraceptive device: Secondary | ICD-10-CM

## 2022-02-14 DIAGNOSIS — Z30430 Encounter for insertion of intrauterine contraceptive device: Secondary | ICD-10-CM

## 2022-02-14 DIAGNOSIS — Z30014 Encounter for initial prescription of intrauterine contraceptive device: Secondary | ICD-10-CM | POA: Diagnosis not present

## 2022-02-14 LAB — CBC
HCT: 32.3 % — ABNORMAL LOW (ref 36.0–46.0)
Hemoglobin: 11.1 g/dL — ABNORMAL LOW (ref 12.0–15.0)
MCH: 30.4 pg (ref 26.0–34.0)
MCHC: 34.4 g/dL (ref 30.0–36.0)
MCV: 88.5 fL (ref 80.0–100.0)
Platelets: 244 10*3/uL (ref 150–400)
RBC: 3.65 MIL/uL — ABNORMAL LOW (ref 3.87–5.11)
RDW: 14.9 % (ref 11.5–15.5)
WBC: 12.7 10*3/uL — ABNORMAL HIGH (ref 4.0–10.5)
nRBC: 0 % (ref 0.0–0.2)

## 2022-02-14 LAB — RAPID HIV SCREEN (HIV 1/2 AB+AG)
HIV 1/2 Antibodies: NONREACTIVE
HIV-1 P24 Antigen - HIV24: NONREACTIVE

## 2022-02-14 LAB — TYPE AND SCREEN
ABO/RH(D): A POS
Antibody Screen: NEGATIVE

## 2022-02-14 MED ORDER — SOD CITRATE-CITRIC ACID 500-334 MG/5ML PO SOLN: 30.0000 mL | ORAL | Status: DC | PRN

## 2022-02-14 MED ORDER — OXYTOCIN BOLUS FROM INFUSION: 333.0000 mL | Freq: Once | INTRAVENOUS | Status: DC

## 2022-02-14 MED ORDER — ONDANSETRON HCL 4 MG/2ML IJ SOLN: 4.0000 mg | Freq: Four times a day (QID) | INTRAMUSCULAR | Status: DC | PRN

## 2022-02-14 MED ORDER — OXYCODONE-ACETAMINOPHEN 5-325 MG PO TABS: 1.0000 | ORAL_TABLET | ORAL | Status: DC | PRN

## 2022-02-14 MED ORDER — OXYCODONE-ACETAMINOPHEN 5-325 MG PO TABS: 2.0000 | ORAL_TABLET | ORAL | Status: DC | PRN

## 2022-02-14 MED ORDER — LACTATED RINGERS IV SOLN: 500.0000 mL | INTRAVENOUS | Status: DC | PRN

## 2022-02-14 MED ORDER — LACTATED RINGERS IV SOLN: INTRAVENOUS | Status: DC

## 2022-02-14 MED ORDER — OXYTOCIN-SODIUM CHLORIDE 30-0.9 UT/500ML-% IV SOLN
2.5000 [IU]/h | INTRAVENOUS | Status: DC
  Filled 2022-02-14: qty 500

## 2022-02-14 MED ORDER — LIDOCAINE HCL (PF) 1 % IJ SOLN: 30.0000 mL | INTRAMUSCULAR | Status: DC | PRN

## 2022-02-14 MED ORDER — ACETAMINOPHEN 325 MG PO TABS: 650.0000 mg | ORAL_TABLET | ORAL | Status: DC | PRN

## 2022-02-14 NOTE — MAU Note (Signed)
Monitors removed pt to wheel chair for transfer to LD 210.

## 2022-02-14 NOTE — H&P (Signed)
OBSTETRIC ADMISSION HISTORY AND PHYSICAL  Donna Simon is a 21 y.o. female [phone] with IUP at [redacted]w[redacted]d by U/S at [redacted]w[redacted]d presenting for spontaneous labor. She reports +FMs, No LOF, no VB, no blurry vision, headaches or peripheral edema, and RUQ pain.  She plans on breast feeding. She requests Paragard for birth control. May consider postplacental if not in too much pain She received her prenatal care at [facility].  Dating: By 8-week U/S --->  Estimated Date of Delivery: 02/22/22  Sono:    @[redacted]w[redacted]d , CWD, normal anatomy, variable presentation, anterior lie, 277g, 64% EFW   Prenatal History/Complications:  - Abnormal NIPS, no birth defect association - Hx FGR in prior pregnancy - Hx multiple miscarriages  Past Medical History: History reviewed. No pertinent past medical history.  Past Surgical History: Past Surgical History:  Procedure Laterality Date   APPENDECTOMY Right 2009    Obstetrical History: OB History     Gravida  5   Para  1   Term  1   Preterm      AB  3   Living  1      SAB  2   IAB      Ectopic      Multiple  0   Live Births  1           Social History Social History   Socioeconomic History   Marital status: Single    Spouse name: Not on file   Number of children: Not on file   Years of education: Not on file   Highest education level: Not on file  Occupational History   Not on file  Tobacco Use   Smoking status: Never   Smokeless tobacco: Never  Vaping Use   Vaping Use: Never used  Substance and Sexual Activity   Alcohol use: Not Currently   Drug use: Never   Sexual activity: Yes  Other Topics Concern   Not on file  Social History Narrative   Not on file   Social Determinants of Health   Financial Resource Strain: Not on file  Food Insecurity: Not on file  Transportation Needs: Not on file  Physical Activity: Not on file  Stress: Not on file  Social Connections: Not on file    Family History: Family History   Problem Relation Age of Onset   Hypertension Mother    Obesity Mother    Miscarriages / 2010 Mother    Obesity Father    Breast cancer Paternal Aunt        dx. 63s, metastatic   Breast cancer Paternal Uncle        dx. 50s   Stomach cancer Maternal Great-grandmother     Allergies: No Known Allergies  Medications Prior to Admission  Medication Sig Dispense Refill Last Dose   docusate sodium (COLACE) 100 MG capsule Take 1 capsule (100 mg total) by mouth 2 (two) times daily as needed. 30 capsule 2 Past Month   Prenatal Vit-Fe Fumarate-FA (PRENATAL MULTIVITAMIN) TABS tablet Take 1 tablet by mouth daily at 12 noon.   02/14/2022   Probiotic Product (PROBIOTIC-10 PO) Take by mouth. (Patient not taking: Reported on 12/04/2021)        Review of Systems   All systems reviewed and negative except as stated in HPI  Blood pressure 124/64, pulse 82, temperature 98.6 F (37 C), temperature source Oral, resp. rate 18, height 5\' 5"  (1.651 m), weight 95.3 kg, SpO2 100 %, not currently breastfeeding. General appearance: alert,  cooperative, and no distress Lungs: clear to auscultation bilaterally Heart: regular rate and rhythm Abdomen: soft, non-tender; bowel sounds normal Pelvic: 8/100/0.  Extremities: Homans sign is negative, no sign of DVT DTR's 2+ Presentation: cephalic Fetal monitoringBaseline: 145 bpm, Variability: Good {> 6 bpm), Accelerations: Reactive, and Decelerations: Absent Uterine activity2-4 Dilation: 8 Effacement (%): 100 Station: 0 Exam by:: Fran,CNM   Prenatal labs: ABO, Rh: --/--/A POS (07/13 2135) Antibody: NEG (07/13 2135) Rubella: 2.76 (01/09 1346) RPR: Non Reactive (05/02 1118)  HBsAg: Negative (01/09 1346)  HIV: NON REACTIVE (07/13 2136)  GBS: Negative/-- (06/28 1540)  2 hr Glucola normal Genetic screening with abnormal NIPS, followed up with genetic counselor - no anticipated birth defects Anatomy [place] normal  Prenatal Transfer Tool  Maternal  Diabetes: No Genetic Screening: Abnormal:  Results: Other: chromosome 7 deletion Maternal Ultrasounds/Referrals: Normal Fetal Ultrasounds or other Referrals:  None Maternal Substance Abuse:  No Significant Maternal Medications:  None Significant Maternal Lab Results: Group B Strep negative  Results for orders placed or performed during the hospital encounter of 02/14/22 (from the past 24 hour(s))  Type and screen [facility]   Collection Time: 02/14/22  9:35 PM  Result Value Ref Range   ABO/RH(D) A POS    Antibody Screen NEG    Sample Expiration      02/17/2022,2359 Performed at [facility], [address] [place], [place] [address]   CBC   Collection Time: 02/14/22  9:36 PM  Result Value Ref Range   WBC 12.7 (H) 4.0 - 10.5 K/uL   RBC 3.65 (L) 3.87 - 5.11 MIL/uL   Hemoglobin 11.1 (L) 12.0 - 15.0 g/dL   HCT 88.5 (L) 02.7 - 74.1 %   MCV 88.5 80.0 - 100.0 fL   MCH 30.4 26.0 - 34.0 pg   MCHC 34.4 30.0 - 36.0 g/dL   RDW 28.7 86.7 - 67.2 %   Platelets 244 150 - 400 K/uL   nRBC 0.0 0.0 - 0.2 %  Rapid HIV screen (HIV 1/2 Ab+Ag)   Collection Time: 02/14/22  9:36 PM  Result Value Ref Range   HIV-1 P24 Antigen - HIV24 NON REACTIVE NON REACTIVE   HIV 1/2 Antibodies NON REACTIVE NON REACTIVE   Interpretation (HIV Ag Ab)      A non reactive test result means that HIV 1 or HIV 2 antibodies and HIV 1 p24 antigen were not detected in the specimen.    Patient Active Problem List   Diagnosis Date Noted   Indication for care in labor or delivery 02/14/2022   deletion of portion of chromosome 7, see notes 10/25/21 12/04/2021   Hemorrhoids during pregnancy in second trimester 10/23/2021   History of multiple miscarriages 08/14/2021   Supervision of normal pregnancy 07/16/2021    Assessment/Plan:  Donna Simon is a 21 y.o. G5P1031 at [redacted]w[redacted]d here for spontaneous labor.  #Labor:AROM w/ clear fluid #Pain: Desires waterbirth, getting in tub #FWB: Cat 1 #ID:  GBS  negative #MOF: Breast #MOC: Paragard #Circ:  N/A  Jacklyn Shell, CNM  02/14/2022, 10:56 PM

## 2022-02-14 NOTE — MAU Note (Signed)
..  Donna Simon is a 21 y.o. at [redacted]w[redacted]d here in MAU reporting: reports contractions since 1pm that are now every 2-3 minutes. +FM. Denies leaking of fluid. Reports pink-ish mucouslike discharge  Pain score: 5/10 Vitals:   02/14/22 2031  BP: 131/74  Pulse: 93  Resp: 18  Temp: 98.7 F (37.1 C)  SpO2: 100%     FHT:135   SVE yesterday 1.5cm

## 2022-02-15 ENCOUNTER — Encounter (HOSPITAL_COMMUNITY): Payer: Self-pay | Admitting: Family Medicine

## 2022-02-15 DIAGNOSIS — Z975 Presence of (intrauterine) contraceptive device: Secondary | ICD-10-CM

## 2022-02-15 DIAGNOSIS — Z30014 Encounter for initial prescription of intrauterine contraceptive device: Secondary | ICD-10-CM

## 2022-02-15 DIAGNOSIS — Z3A38 38 weeks gestation of pregnancy: Secondary | ICD-10-CM

## 2022-02-15 LAB — RPR: RPR Ser Ql: NONREACTIVE

## 2022-02-15 MED ORDER — ONDANSETRON HCL 4 MG/2ML IJ SOLN: 4.0000 mg | INTRAMUSCULAR | Status: DC | PRN

## 2022-02-15 MED ORDER — IBUPROFEN 600 MG PO TABS
600.0000 mg | ORAL_TABLET | Freq: Four times a day (QID) | ORAL | Status: DC
  Administered 2022-02-15 – 2022-02-16 (×2): 600 mg via ORAL
  Filled 2022-02-15 (×4): qty 1

## 2022-02-15 MED ORDER — DIPHENHYDRAMINE HCL 25 MG PO CAPS: 25.0000 mg | ORAL_CAPSULE | Freq: Four times a day (QID) | ORAL | Status: DC | PRN

## 2022-02-15 MED ORDER — MEDROXYPROGESTERONE ACETATE 150 MG/ML IM SUSP: 150.0000 mg | INTRAMUSCULAR | Status: DC | PRN

## 2022-02-15 MED ORDER — SENNOSIDES-DOCUSATE SODIUM 8.6-50 MG PO TABS
2.0000 | ORAL_TABLET | ORAL | Status: DC
  Administered 2022-02-15: 2 via ORAL
  Filled 2022-02-15: qty 2

## 2022-02-15 MED ORDER — PARAGARD INTRAUTERINE COPPER IU IUD
1.0000 | INTRAUTERINE_SYSTEM | Freq: Once | INTRAUTERINE | Status: AC
  Administered 2022-02-15: 1 via INTRAUTERINE
  Filled 2022-02-15: qty 1

## 2022-02-15 MED ORDER — METHYLERGONOVINE MALEATE 0.2 MG PO TABS: 0.2000 mg | ORAL_TABLET | ORAL | Status: DC | PRN

## 2022-02-15 MED ORDER — FLEET ENEMA 7-19 GM/118ML RE ENEM: 1.0000 | ENEMA | Freq: Every day | RECTAL | Status: DC | PRN

## 2022-02-15 MED ORDER — COCONUT OIL OIL: 1.0000 | TOPICAL_OIL | Status: DC | PRN

## 2022-02-15 MED ORDER — PRENATAL MULTIVITAMIN CH
1.0000 | ORAL_TABLET | Freq: Every day | ORAL | Status: DC
  Administered 2022-02-15 – 2022-02-16 (×2): 1 via ORAL
  Filled 2022-02-15 (×2): qty 1

## 2022-02-15 MED ORDER — TETANUS-DIPHTH-ACELL PERTUSSIS 5-2.5-18.5 LF-MCG/0.5 IM SUSY: 0.5000 mL | PREFILLED_SYRINGE | Freq: Once | INTRAMUSCULAR | Status: DC

## 2022-02-15 MED ORDER — BENZOCAINE-MENTHOL 20-0.5 % EX AERO: 1.0000 | INHALATION_SPRAY | CUTANEOUS | Status: DC | PRN

## 2022-02-15 MED ORDER — SIMETHICONE 80 MG PO CHEW: 80.0000 mg | CHEWABLE_TABLET | ORAL | Status: DC | PRN

## 2022-02-15 MED ORDER — METHYLERGONOVINE MALEATE 0.2 MG/ML IJ SOLN: 0.2000 mg | INTRAMUSCULAR | Status: DC | PRN

## 2022-02-15 MED ORDER — FERROUS SULFATE 325 (65 FE) MG PO TABS
325.0000 mg | ORAL_TABLET | ORAL | Status: DC
  Administered 2022-02-15: 325 mg via ORAL
  Filled 2022-02-15: qty 1

## 2022-02-15 MED ORDER — MEASLES, MUMPS & RUBELLA VAC IJ SOLR: 0.5000 mL | Freq: Once | INTRAMUSCULAR | Status: DC

## 2022-02-15 MED ORDER — ACETAMINOPHEN 325 MG PO TABS: 650.0000 mg | ORAL_TABLET | ORAL | Status: DC | PRN

## 2022-02-15 MED ORDER — DIBUCAINE (PERIANAL) 1 % EX OINT: 1.0000 | TOPICAL_OINTMENT | CUTANEOUS | Status: DC | PRN

## 2022-02-15 MED ORDER — BISACODYL 10 MG RE SUPP: 10.0000 mg | Freq: Every day | RECTAL | Status: DC | PRN

## 2022-02-15 MED ORDER — ONDANSETRON HCL 4 MG PO TABS: 4.0000 mg | ORAL_TABLET | ORAL | Status: DC | PRN

## 2022-02-15 MED ORDER — WITCH HAZEL-GLYCERIN EX PADS: 1.0000 | MEDICATED_PAD | CUTANEOUS | Status: DC | PRN

## 2022-02-15 NOTE — Progress Notes (Signed)
  Post-Placental IUD Insertion Procedure Note  Patient identified, informed consent signed prior to delivery, signed copy in chart, time out was performed.    Vaginal, labial and perineal areas thoroughly inspected for lacerations. None found.  Paragard inserted with inserter per manufacturer's instructions.    Strings trimmed to the level of the introitus. Patient tolerated procedure well.    Patient given post procedure instructions and IUD care card with expiration date.  Patient is asked to keep IUD strings tucked in her vagina until her postpartum follow up visit in 4-6 weeks. Patient advised to abstain from sexual intercourse and pulling on strings before her follow-up visit. Patient verbalized an understanding of the plan of care and agrees.

## 2022-02-15 NOTE — Lactation Note (Signed)
This note was copied from a baby's chart. Lactation Consultation Note  Patient Name: Donna Simon LHTDS'K Date: 02/15/2022   Age:21 hours Attempted to see mom. Baby had latched on the breast. Now Rn was fixing to assess baby. Mom stated BF went well. Lactation will f/u mom today.  Maternal Data    Feeding    LATCH Score                    Lactation Tools Discussed/Used    Interventions    Discharge    Consult Status      Charyl Dancer 02/15/2022, 1:42 AM

## 2022-02-15 NOTE — Discharge Summary (Shared)
Postpartum Discharge Summary  Date of Service updated***     Patient Name: Donna Simon DOB: 09-07-2000 MRN: [id number]  Date of admission: 02/14/2022 Delivery date:02/15/2022  Delivering provider: Christin Fudge  Date of discharge: 02/15/2022  Admitting diagnosis: Indication for care in labor or delivery [O75.9] Intrauterine pregnancy: [redacted]w[redacted]d    Secondary diagnosis:  Principal Problem:   Indication for care in labor or delivery  Additional problems: none    Discharge diagnosis: Term Pregnancy Delivered                                              Post partum procedures: Paragard insertion Augmentation: AROM Complications: None  Hospital course: Onset of Labor With Vaginal Delivery      21y.o. yo [id number] 315w0das admitted in Active Labor on 02/14/2022. Patient had an uncomplicated labor course as follows:  Membrane Rupture Time/Date: 10:50 PM ,02/14/2022   Delivery Method:Vaginal, Spontaneous  Episiotomy: None  Lacerations:  None  Patient had an uncomplicated postpartum course.  She is ambulating, tolerating a regular diet, passing flatus, and urinating well. Patient is discharged home in stable condition on 02/15/22.  Newborn Data: Birth date:02/15/2022  Birth time:12:22 AM  Gender:Female  Living status:Living  Apgars:8 ,9  Weight:   Magnesium Sulfate received: No BMZ received: No Rhophylac:N/A MMR:N/A T-DaP:Given prenatally Flu: Yes Transfusion:{Transfusion received:30440034}  Physical exam  Vitals:   02/14/22 2031 02/14/22 2154 02/14/22 2159 02/15/22 0057  BP: 131/74 124/64    Pulse: 93 82    Resp: 18  18   Temp: 98.7 F (37.1 C) 98.6 F (37 C)  98.5 F (36.9 C)  TempSrc: Oral Oral  Oral  SpO2: 100%     Weight: 95.3 kg     Height: '5\' 5"'  (1.651 m)      General: {Exam; general:21111117} Lochia: {Desc; appropriate/inappropriate:30686::"appropriate"} Uterine Fundus: {Desc; firm/soft:30687} Incision: {Exam; incision:21111123} DVT  Evaluation: {Exam; dvZYT:4621947}abs: Lab Results  Component Value Date   WBC 12.7 (H) 02/14/2022   HGB 11.1 (L) 02/14/2022   HCT 32.3 (L) 02/14/2022   MCV 88.5 02/14/2022   PLT 244 02/14/2022      Latest Ref Rng & Units 12/31/2020    9:05 PM  CMP  Glucose 70 - 99 mg/dL 83   BUN 6 - 20 mg/dL 10   Creatinine 0.44 - 1.00 mg/dL 0.74   Sodium 135 - 145 mmol/L 139   Potassium 3.5 - 5.1 mmol/L 4.6   Chloride 98 - 111 mmol/L 107   CO2 22 - 32 mmol/L 27   Calcium 8.9 - 10.3 mg/dL 9.1   Total Protein 6.5 - 8.1 g/dL 6.7   Total Bilirubin 0.3 - 1.2 mg/dL 0.3   Alkaline Phos 38 - 126 U/L 58   AST 15 - 41 U/L 22   ALT 0 - 44 U/L 21    Edinburgh Score:    10/03/2020   11:03 AM  Edinburgh Postnatal Depression Scale Screening Tool  I have been able to laugh and see the funny side of things. 0  I have looked forward with enjoyment to things. 0  I have blamed myself unnecessarily when things went wrong. 0  I have been anxious or worried for no good reason. 0  I have felt scared or panicky for no good reason. 0  Things have been getting on top of  me. 0  I have been so unhappy that I have had difficulty sleeping. 0  I have felt sad or miserable. 0  I have been so unhappy that I have been crying. 0  The thought of harming myself has occurred to me. 0  Edinburgh Postnatal Depression Scale Total 0     After visit meds:  Allergies as of 02/15/2022   No Known Allergies   Med Rec must be completed prior to using this [facility]***        Discharge home in stable condition Infant Feeding: Breast Infant Disposition:{CHL IP OB HOME WITH OVFIEP:32951} Discharge instruction: per After Visit Summary and Postpartum booklet. Activity: Advance as tolerated. Pelvic rest for 6 weeks.  Diet: {OB OACZ:66063016} Future Appointments: Future Appointments  Date Time Provider [place]  02/19/2022  2:30 PM Leftwich-Kirby, Kathie Dike, CNM [facility] None   Follow up Visit:   Please schedule  this patient for a In person postpartum visit in 4 weeks with the following provider: Any provider. Additional Postpartum F/U: IUD string check   Low risk pregnancy complicated by:  Delivery mode:  Vaginal, Spontaneous  Anticipated Birth Control:  PP IUD placed   02/15/2022 Christin Fudge, CNM

## 2022-02-19 ENCOUNTER — Encounter: Payer: Medicaid Other | Admitting: Advanced Practice Midwife

## 2022-02-23 ENCOUNTER — Telehealth (HOSPITAL_COMMUNITY): Payer: Self-pay

## 2022-02-23 NOTE — Telephone Encounter (Signed)
Patient reports she is doing well. Patient declines questions/concerns about her health and healing.  Patient reports that baby is doing well. Eating, peeing/pooping, and gaining weight well. Baby sleeps in a bassinet. RN reviewed ABC's of safe sleep with patient. Patient declines any questions or concerns about baby.  EPDS score is 0.  Marcelino Duster [facility]  02/23/22,1435

## 2022-02-25 ENCOUNTER — Encounter: Payer: Self-pay | Admitting: Obstetrics and Gynecology

## 2022-02-25 ENCOUNTER — Ambulatory Visit (INDEPENDENT_AMBULATORY_CARE_PROVIDER_SITE_OTHER): Payer: Medicaid Other | Admitting: Obstetrics and Gynecology

## 2022-02-25 VITALS — BP 115/76 | HR 68 | Ht 65.0 in | Wt 193.0 lb

## 2022-02-25 DIAGNOSIS — Z30431 Encounter for routine checking of intrauterine contraceptive device: Secondary | ICD-10-CM

## 2022-02-25 NOTE — Progress Notes (Signed)
1 Week PP c/o IUD string causing pinching when she walks.

## 2022-02-25 NOTE — Progress Notes (Signed)
21 yo P2 s/p SVD on 02/15/22 with postplacental IUD placed presenting today reporting vaginal discomfort with IUD strings. Patient reports feeling well otherwise. She is breastfeeding. She receives ample assistance from her partner who is on paternity leave and her mother. She is without other complaints    ROS See pertinent in HPI. All other systems reviewed and non contributory Blood pressure 115/76, pulse 68, height 5\' 5"  (1.651 m), weight 193 lb (87.5 kg), currently breastfeeding. GENERAL: Well-developed, well-nourished female in no acute distress.  ABDOMEN: Soft, nontender, nondistended. No organomegaly. PELVIC: Normal external female genitalia. Vagina is pink and rugated.  Normal discharge. Normal appearing cervix with long IUD strings. Chaperone present during the pelvic exam EXTREMITIES: No cyanosis, clubbing, or edema, 2+ distal pulses.  A/P 21 yo here for IUD check - IUD strings trimmed to 2 cm - Patient to return as scheduled for postpartum visit

## 2022-03-19 ENCOUNTER — Ambulatory Visit (INDEPENDENT_AMBULATORY_CARE_PROVIDER_SITE_OTHER): Payer: Medicaid Other | Admitting: Advanced Practice Midwife

## 2022-03-19 ENCOUNTER — Encounter: Payer: Self-pay | Admitting: Advanced Practice Midwife

## 2022-03-19 DIAGNOSIS — O3510X0 Maternal care for (suspected) chromosomal abnormality in fetus, unspecified, not applicable or unspecified: Secondary | ICD-10-CM | POA: Insufficient documentation

## 2022-03-19 NOTE — Progress Notes (Signed)
Post Partum Visit Note  Donna Simon is a 21 y.o. [phone] female who presents for a postpartum visit. She is 4 week postpartum following a normal spontaneous vaginal delivery.  I have fully reviewed the prenatal and intrapartum course. The delivery was at 39 gestational weeks.  Anesthesia: none. Postpartum course has been unremarkable. Baby is doing well. Baby is feeding by breast. Bleeding staining only. Bowel function is normal. Bladder function is normal. Patient is not sexually active. Contraception method is IUD. Postpartum depression screening: negative.   Upstream - 03/19/22 1111       Pregnancy Intention Screening   Does the patient want to become pregnant in the next year? No    Does the patient's partner want to become pregnant in the next year? No    Would the patient like to discuss contraceptive options today? No            The pregnancy intention screening data noted above was reviewed. Potential methods of contraception were discussed. The patient elected to proceed with No data recorded.   Edinburgh Postnatal Depression Scale - 03/19/22 1110       Edinburgh Postnatal Depression Scale:  In the Past 7 Days   I have been able to laugh and see the funny side of things. 0    I have looked forward with enjoyment to things. 0    I have blamed myself unnecessarily when things went wrong. 0    I have been anxious or worried for no good reason. 0    I have felt scared or panicky for no good reason. 0    Things have been getting on top of me. 0    I have been so unhappy that I have had difficulty sleeping. 0    I have felt sad or miserable. 0    I have been so unhappy that I have been crying. 0    The thought of harming myself has occurred to me. 0    Edinburgh Postnatal Depression Scale Total 0             Health Maintenance Due  Topic Date Due   HPV VACCINES (1 - 2-dose series) Never done   COVID-19 Vaccine (3 - Pfizer series) 08/25/2020   INFLUENZA  VACCINE  03/05/2022    The following portions of the patient's history were reviewed and updated as appropriate: allergies, current medications, past family history, past medical history, past social history, past surgical history, and problem list.  Review of Systems A comprehensive review of systems was negative.  Objective:  BP 108/64   Pulse (!) 59   Ht 5\' 5"  (1.651 m)   Wt 189 lb 9.6 oz (86 kg)   LMP  (LMP Unknown) Comment: IUD  Breastfeeding Yes   BMI 31.55 kg/m    VS reviewed, nursing note reviewed,  Constitutional: well developed, well nourished, no distress HEENT: normocephalic CV: normal rate Pulm/chest wall: normal effort Abdomen: soft Neuro: alert and oriented x 3 Skin: warm, dry Psych: affect normal   Pelvic exam: Cervix pink, visually closed, without lesion, scant white creamy discharge, Paragard string ~ 2 cm in length from cervical os, vaginal walls and external genitalia normal   Assessment:   1. Postpartum care following vaginal delivery --Doing well, bonding well with baby, breastfeeding, good support at home    Plan:   Essential components of care per ACOG recommendations:  1.  Mood and well being: Patient with negative depression screening today. Reviewed  local resources for support.  - Patient tobacco use? No.   - hx of drug use? No.    2. Infant care and feeding:  -Patient currently breastmilk feeding? Yes. Reviewed importance of draining breast regularly to support lactation.  -Social determinants of health (SDOH) reviewed in EPIC. No concerns    3. Sexuality, contraception and birth spacing - Patient does not want a pregnancy in the next year.   - Paragard IUD placed in hospital after delivery, no complications or problems with IUD.  4. Sleep and fatigue -Encouraged family/partner/community support of 4 hrs of uninterrupted sleep to help with mood and fatigue  5. Physical Recovery  - Discussed patients delivery and complications. She  describes her labor as good. - Patient had a Vaginal, no problems at delivery. Pt had a successful waterbirth.  Patient had no laceration. Perineal healing reviewed. Patient expressed understanding - Patient has urinary incontinence? No. - Patient is safe to resume physical and sexual activity  6.  Health Maintenance - HM due items addressed Yes - Last pap smear  Diagnosis  Date Value Ref Range Status  10/03/2020   Final   - Negative for intraepithelial lesion or malignancy (NILM)   Pap smear not done at today's visit.  -Breast Cancer screening indicated? No.   7. Chronic Disease/Pregnancy Condition follow up: None  - PCP follow up  Sharen Counter, CNM [facility] [employer], [facility]

## 2022-04-04 ENCOUNTER — Telehealth: Payer: Self-pay

## 2022-04-04 ENCOUNTER — Other Ambulatory Visit: Payer: Self-pay

## 2022-04-04 DIAGNOSIS — B9689 Other specified bacterial agents as the cause of diseases classified elsewhere: Secondary | ICD-10-CM

## 2022-04-04 MED ORDER — METRONIDAZOLE 500 MG PO TABS: 500.0000 mg | ORAL_TABLET | Freq: Two times a day (BID) | ORAL | 0 refills | Status: DC

## 2022-04-04 NOTE — Telephone Encounter (Signed)
Patient called stating that she is having vaginal discharge with odor. Requesting rx for bv. [place] at [place]. RX sent to a [facility] in [place], [place] per patient request.

## 2022-05-01 IMAGING — US US OB < 14 WEEKS - US OB TV
1 series · 8 of 8 positions shown · non-contrast
Comparison: December 31, 2020

CLINICAL DATA: Cramps x1 week with spotting.

EXAM:
OBSTETRIC <14 WK US AND TRANSVAGINAL OB US
TECHNIQUE: Both transabdominal and transvaginal ultrasound examinations were
performed for complete evaluation of the gestation as well as the
maternal uterus, adnexal regions, and pelvic cul-de-sac.
Transvaginal technique was performed to assess early pregnancy.

[Series 1: us ob < 14 weeks - us ob tv · 8 of 8 slices shown]
[im 1/8]
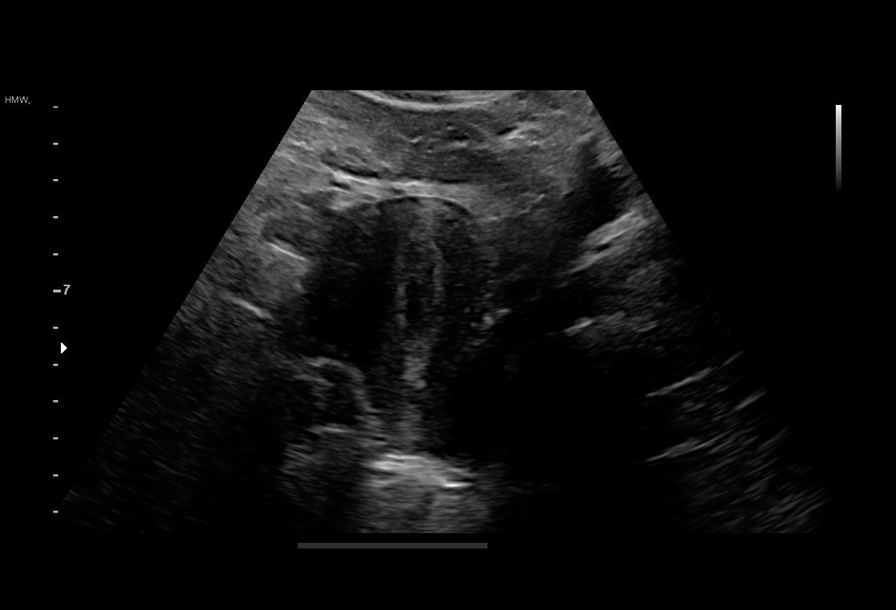
[im 2/8]
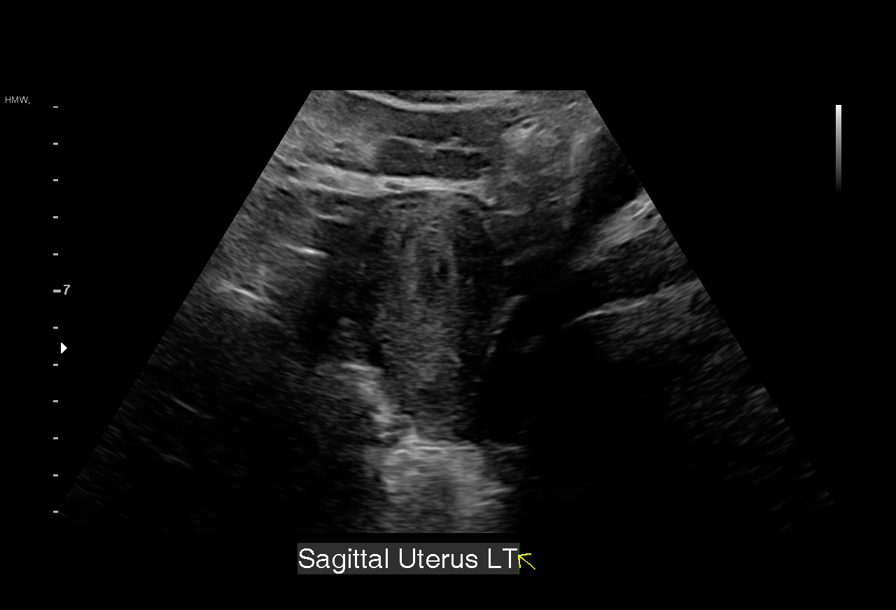
[im 3/8]
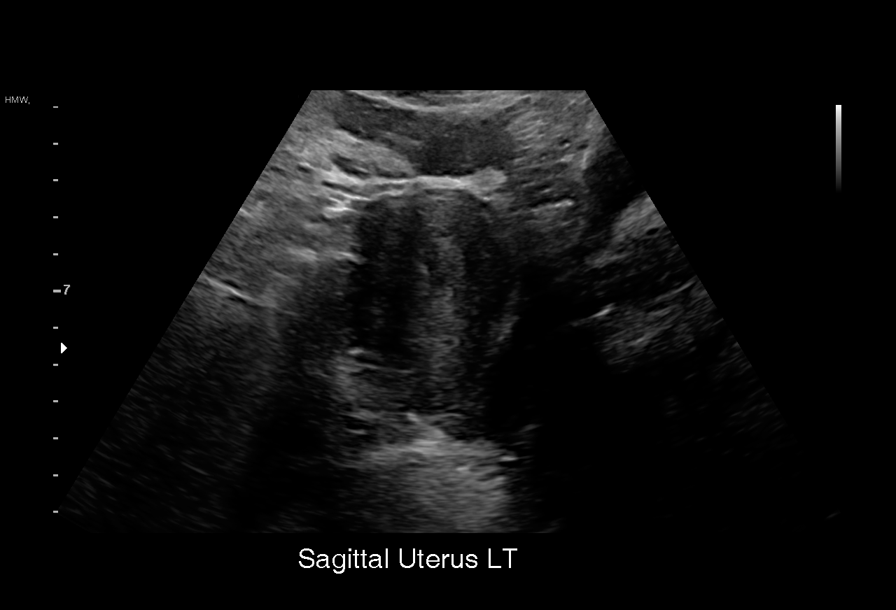
[im 4/8]
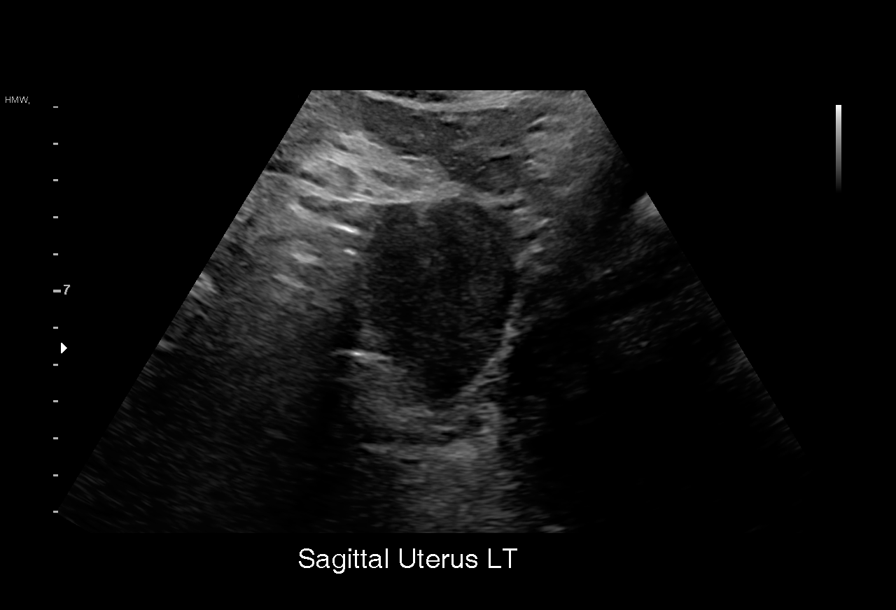
[im 5/8]
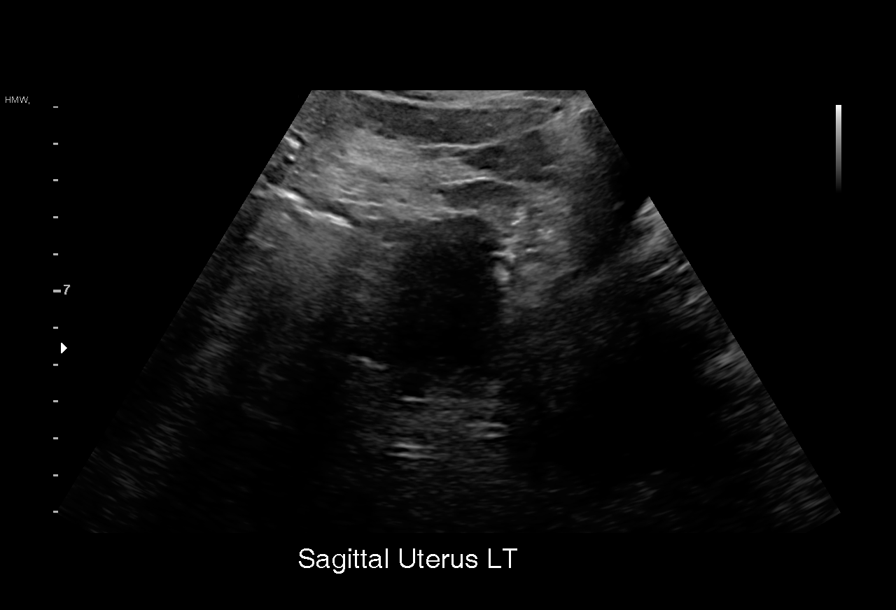
[im 6/8]
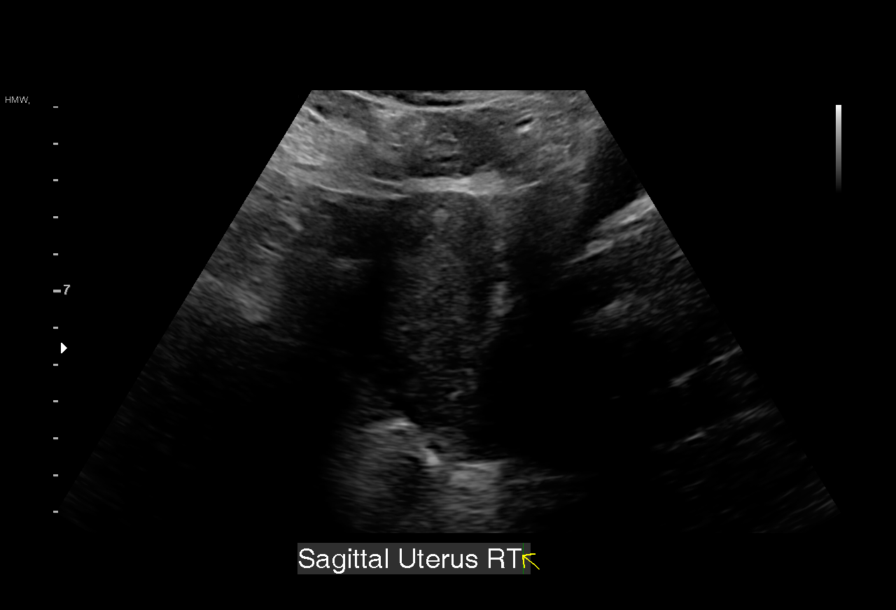
[im 7/8]
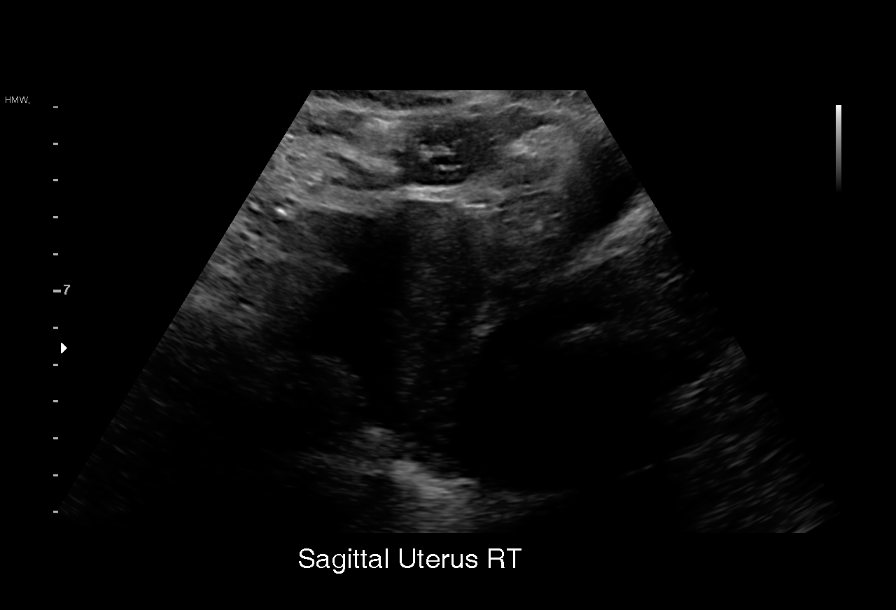
[im 8/8]
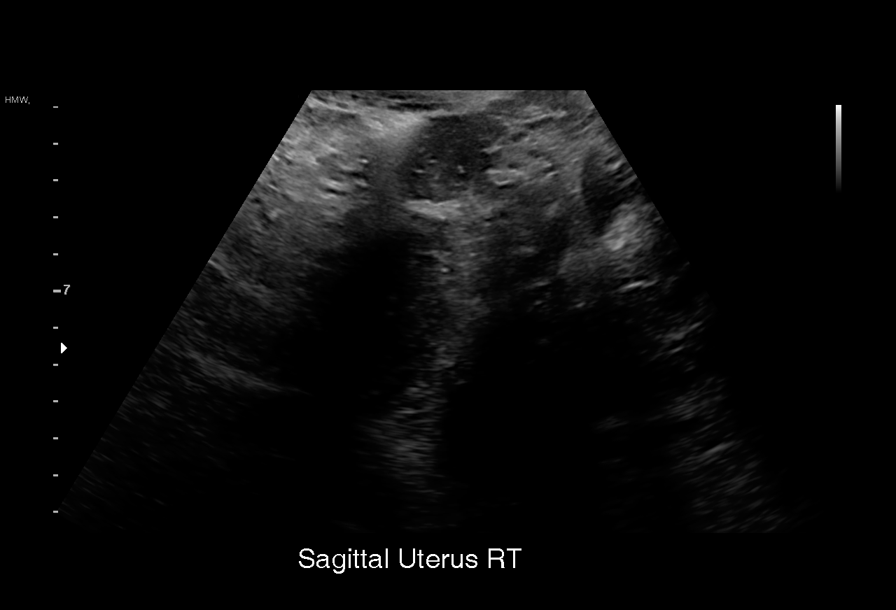

[8 of 8 positions shown; findings below may reference images not displayed]

FINDINGS: Intrauterine gestational sac: None

Yolk sac:  Not Visualized.

Embryo:  Not Visualized.

Cardiac Activity: Not Visualized.

Heart Rate: N/A  bpm

Subchorionic hemorrhage:  None visualized.

Maternal uterus/adnexae: The endometrium measures 11 mm in thickness
and is normal in appearance.

The bilateral ovaries are visualized and are normal in appearance.

No pelvic free fluid is seen.
IMPRESSION: No evidence of an intrauterine pregnancy.

## 2022-09-19 IMAGING — US US MFM OB DETAIL+14 WK
1 series · 13 of 28 positions shown · non-contrast
Comparison: none

[Series 1: us mfm ob detail+14 wk · 13 of 108 slices shown]
[im 4/108]
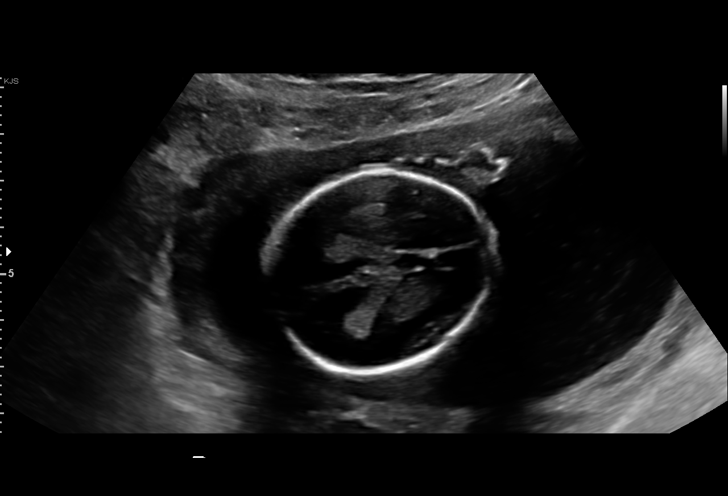
[im 12/108]
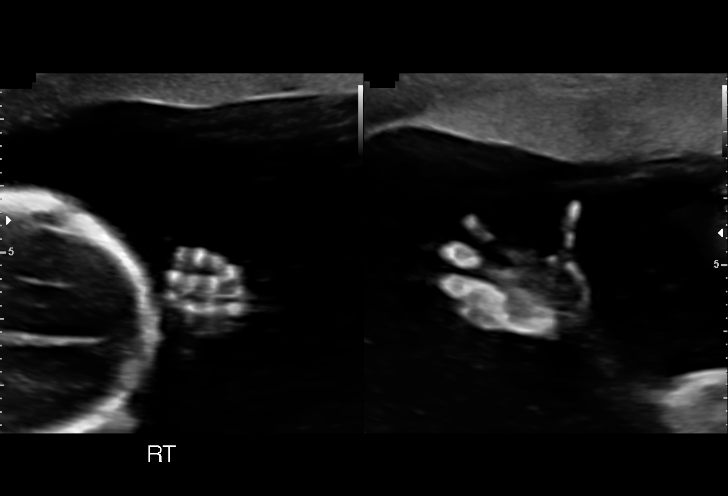
[im 20/108]
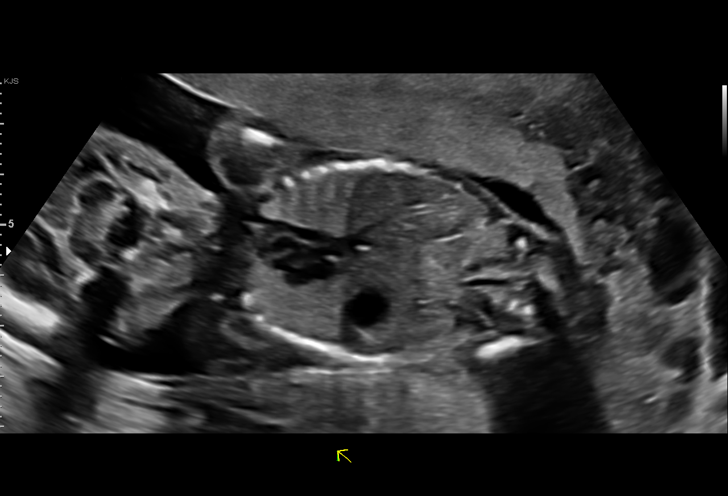
[im 28/108]
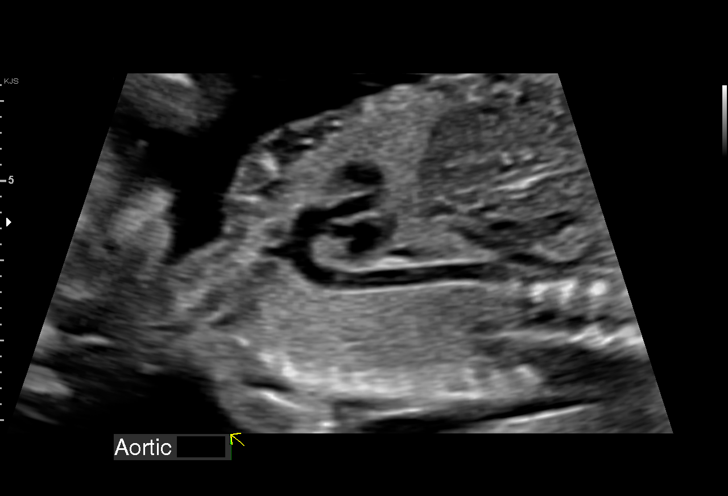
[im 36/108]
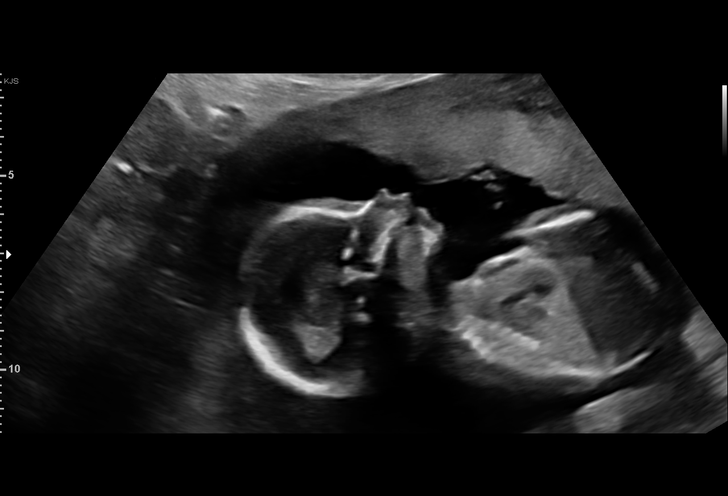
[im 44/108]
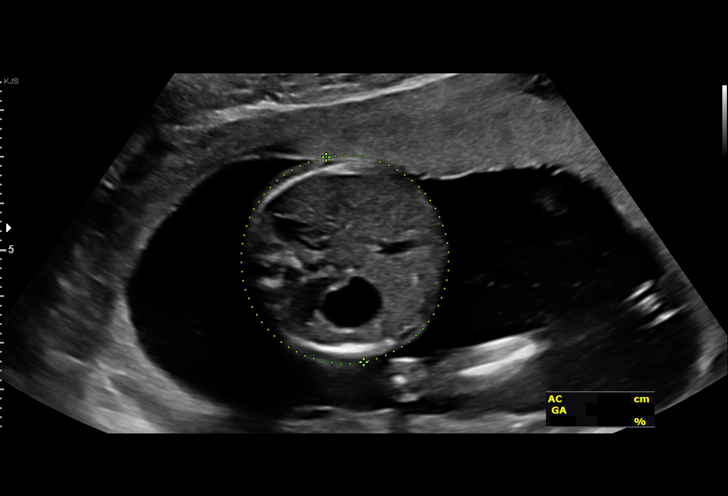
[im 56/108]
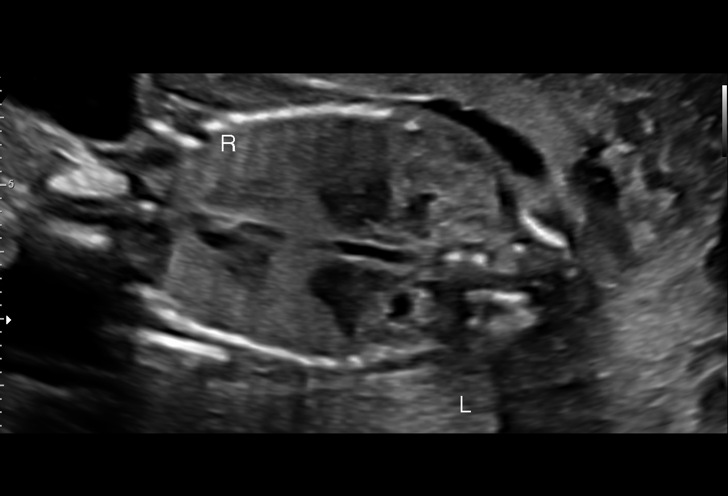
[im 64/108]
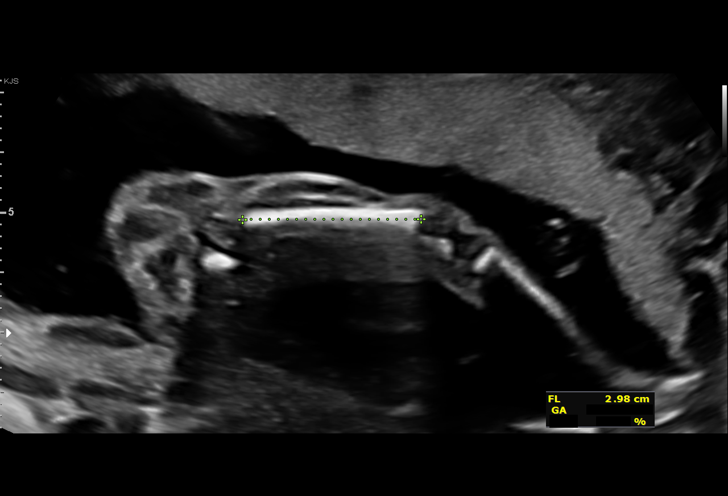
[im 72/108]
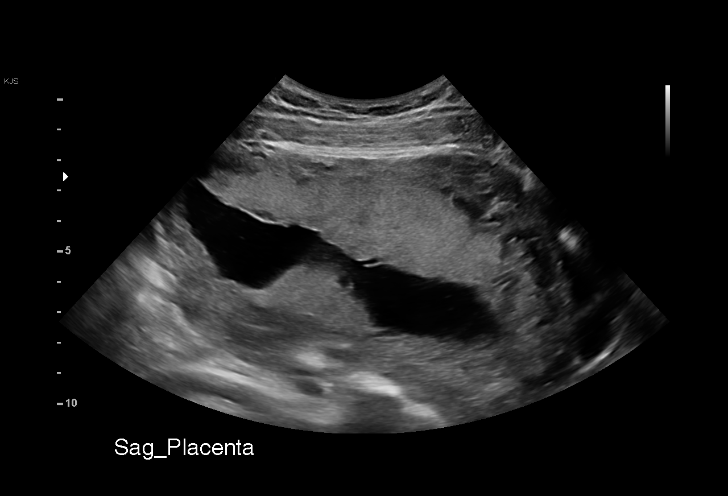
[im 80/108]
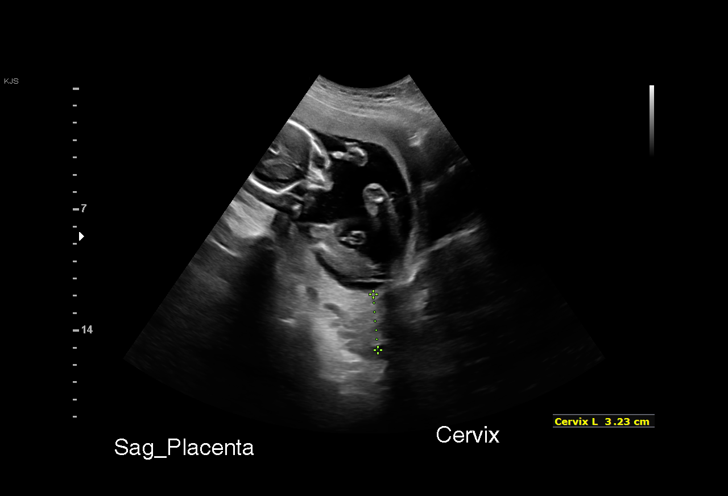
[im 88/108]
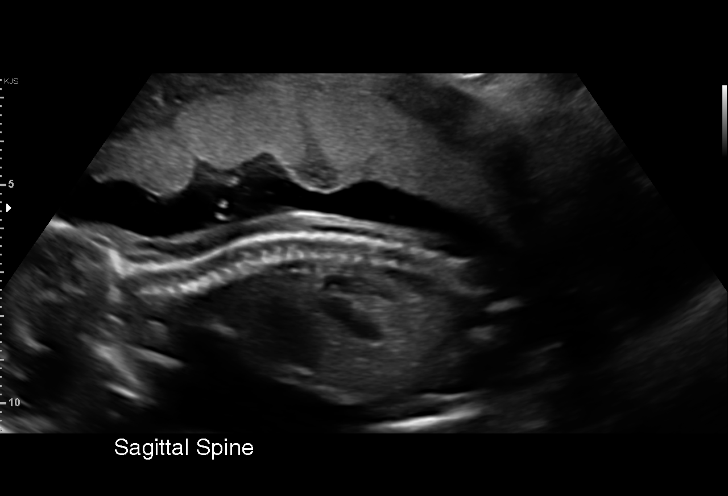
[im 96/108]
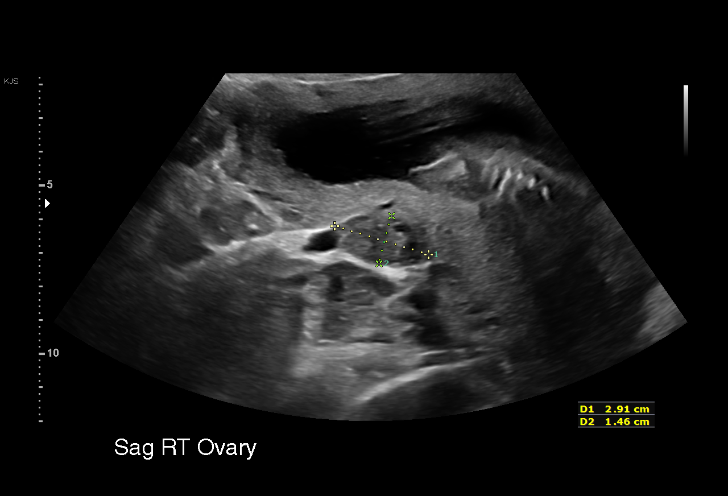
[im 104/108]
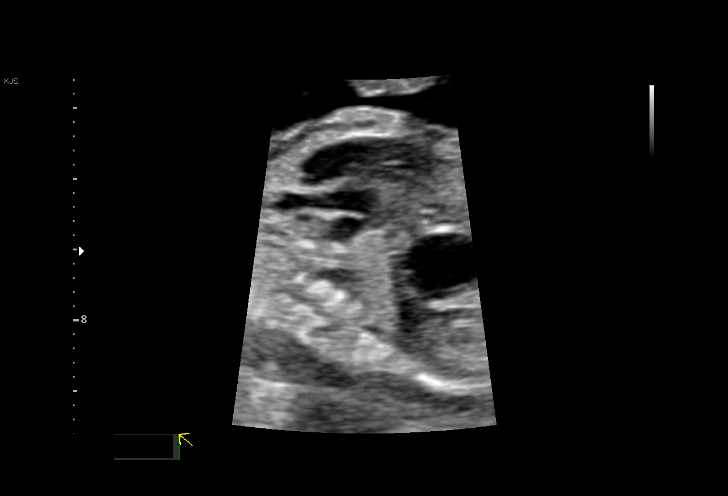

[13 of 28 positions shown; findings below may reference images not displayed]

Indications

 Encounter for antenatal screening for
 malformations
 Abnormal chromosomal and genetic finding
 on antenatal screening of mother (atypical
 finding on sex chromosome)
 18 weeks gestation of pregnancy
 Horizon/AFP Neg
Fetal Evaluation

 Num Of Fetuses:         1
 Fetal Heart Rate(bpm):  157
 Cardiac Activity:       Observed
 Presentation:           Variable
 Placenta:               Anterior
 P. Cord Insertion:      Visualized, central

 Amniotic Fluid
 AFI FV:      Within normal limits

                             Largest Pocket(cm)

Biometry

 BPD:        42  mm     G. Age:  18w 5d         44  %    CI:        76.09   %    70 - 86
                                                         FL/HC:      19.4   %    16.1 -
 HC:      152.6  mm     G. Age:  18w 2d         17  %    HC/AC:      1.09        1.09 -
 AC:      140.4  mm     G. Age:  19w 3d         65  %    FL/BPD:     70.5   %
 FL:       29.6  mm     G. Age:  19w 1d         54  %    FL/AC:      21.1   %    20 - 24
 HUM:      28.8  mm     G. Age:  19w 2d         64  %
 CER:      19.2  mm     G. Age:  18w 5d         32  %
 NFT:       2.3  mm

 LV:        6.3  mm
 CM:        4.4  mm

 Est. FW:     277  gm    0 lb 10 oz      64  %
Gestational Age

 U/S Today:     18w 6d                                        EDD:   02/22/22
 Best:          18w 6d     Det. By:  U/S C R L  (07/16/21)    EDD:   02/22/22
Anatomy

 Cranium:               Appears normal         LVOT:                   Appears normal
 Cavum:                 Appears normal         Aortic Arch:            Appears normal
 Ventricles:            Appears normal         Ductal Arch:            Appears normal
 Choroid Plexus:        Appears normal         Diaphragm:              Appears normal
 Cerebellum:            Appears normal         Stomach:                Appears normal, left
                                                                       sided
 Posterior Fossa:       Appears normal         Abdomen:                Appears normal
 Nuchal Fold:           Appears normal         Abdominal Wall:         Appears nml (cord
                                                                       insert, abd wall)
 Face:                  Appears normal         Cord Vessels:           Appears normal (3
                        (orbits and profile)                           vessel cord)
 Lips:                  Appears normal         Kidneys:                Appear normal
 Palate:                Appears normal         Bladder:                Appears normal
 Thoracic:              Appears normal         Spine:                  Appears normal
 Heart:                 Appears normal         Upper Extremities:      Appears normal
                        (4CH, axis, and
                        situs)
 RVOT:                  Appears normal         Lower Extremities:      Appears normal,
                                                                       feet not well seen

 Other:  Heels/feet and open hands/5th digits visualized, 3VV and 3VTV
         visualized. . Female gender previously seen.
Cervix Uterus Adnexa

 Cervix
 Length:           3.23  cm.
 Normal appearance by transabdominal scan.

 Uterus
 No abnormality visualized.

 Right Ovary
 Within normal limits.

 Left Ovary
 Within normal limits.

 Cul De Sac
 No free fluid seen.

 Adnexa
 No adnexal mass visualized.
Impression

 Patient returned for fetal anatomical survey.  Following
 atypical finding on sex chromosomes on cell-free fetal DNA
 screening, the patient had amniocentesis.  Fetal karyotype is
 reported as normal (46,XX).
 We performed fetal anatomy scan. No makers of
 aneuploidies or fetal structural defects are seen. Fetal
 biometry is consistent with her previously-established dates.
 Amniotic fluid is normal and good fetal activity is seen.
 Patient understands the limitations of ultrasound in detecting
 fetal anomalies.
 We reassured the patient of the findings
Recommendations

 Follow-up scans as clinically indicated.
                 Minaya, Mando

## 2024-02-20 ENCOUNTER — Other Ambulatory Visit (HOSPITAL_COMMUNITY)
Admission: RE | Admit: 2024-02-20 | Discharge: 2024-02-20 | Disposition: A | Discharge status: Home / Self Care | Source: Ambulatory Visit | Attending: Obstetrics and Gynecology | Admitting: Obstetrics and Gynecology

## 2024-02-20 ENCOUNTER — Encounter: Payer: Self-pay | Admitting: Obstetrics and Gynecology

## 2024-02-20 ENCOUNTER — Ambulatory Visit: Admitting: Obstetrics and Gynecology

## 2024-02-20 VITALS — BP 114/70 | HR 65 | Wt 185.3 lb

## 2024-02-20 DIAGNOSIS — Z113 Encounter for screening for infections with a predominantly sexual mode of transmission: Secondary | ICD-10-CM

## 2024-02-20 DIAGNOSIS — R300 Dysuria: Secondary | ICD-10-CM | POA: Diagnosis not present

## 2024-02-20 DIAGNOSIS — Z01419 Encounter for gynecological examination (general) (routine) without abnormal findings: Secondary | ICD-10-CM | POA: Insufficient documentation

## 2024-02-20 DIAGNOSIS — Z975 Presence of (intrauterine) contraceptive device: Secondary | ICD-10-CM

## 2024-02-20 DIAGNOSIS — Z30431 Encounter for routine checking of intrauterine contraceptive device: Secondary | ICD-10-CM | POA: Diagnosis not present

## 2024-02-20 LAB — POCT URINALYSIS DIPSTICK
Bilirubin, UA: NEGATIVE
Blood, UA: POSITIVE
Glucose, UA: NEGATIVE
Ketones, UA: NEGATIVE
Nitrite, UA: NEGATIVE
Odor: NEGATIVE
Protein, UA: POSITIVE — AB
Spec Grav, UA: 1.01 (ref 1.010–1.025)
Urobilinogen, UA: 0.2 U/dL
pH, UA: 6 (ref 5.0–8.0)

## 2024-02-20 MED ORDER — CEFADROXIL 500 MG PO CAPS: 500.0000 mg | ORAL_CAPSULE | Freq: Two times a day (BID) | ORAL | 0 refills | Status: DC

## 2024-02-20 NOTE — Progress Notes (Signed)
 Pt is in the office for annual, currently has IUD Last pap 10/03/2020 Pt complains of burning with urination and odor. Desires std testing and urine culture LMP 02/19/24

## 2024-02-20 NOTE — Progress Notes (Signed)
 GYNECOLOGY ANNUAL PREVENTATIVE CARE ENCOUNTER NOTE  History:     Donna Simon is a 23 y.o. [phone] female here for a routine annual gynecologic exam.  Current complaints: dysuria and frequency of urination for 2-3 days.   Denies abnormal vaginal bleeding, discharge, pelvic pain, problems with intercourse or other gynecologic concerns.  Pt notes regular menses lasting 3-5 days.   Gynecologic History Patient's last menstrual period was 02/19/2024. Contraception: IUD Last Pap: 10/03/20. Results were: normal with negative HPV Last mammogram: n/a  Obstetric History OB History  Gravida Para Term Preterm AB Living  5 2 2  3 2   SAB IAB Ectopic Multiple Live Births  2   0 2    # Outcome Date GA Lbr Len/2nd Weight Sex Type Anes PTL Lv  5 Term 02/15/22 [redacted]w[redacted]d 03:25 / 00:07 6 lb 15.5 oz (3.16 kg) F Vag-Spont None  LIV     Birth Comments: WNL  4A SAB 05/09/21 [redacted]w[redacted]d         4B Gravida           3 AB 12/31/20 [redacted]w[redacted]d    SAB     2 SAB 11/21/20          1 Term 09/01/20 [redacted]w[redacted]d 11:34 / 00:12 6 lb 1 oz (2.75 kg) M Vag-Spont EPI  LIV     Birth Comments: WNL    History reviewed. No pertinent past medical history.  Past Surgical History:  Procedure Laterality Date   APPENDECTOMY Right 2009    Current Outpatient Medications on File Prior to Visit  Medication Sig Dispense Refill   PARAGARD  INTRAUTERINE COPPER  IU 1 Intra Uterine Device by Intrauterine route once.     No current facility-administered medications on file prior to visit.    No Known Allergies  Social History:  reports that she has never smoked. She has never used smokeless tobacco. She reports that she does not currently use alcohol. She reports that she does not use drugs.  Family History  Problem Relation Age of Onset   Hypertension Mother    Obesity Mother    Miscarriages / [place] Mother    Obesity Father    Breast cancer Paternal Aunt        dx. 72s, metastatic   Breast cancer Paternal Uncle        dx. 50s    Stomach cancer Maternal Great-grandmother     The following portions of the patient's history were reviewed and updated as appropriate: allergies, current medications, past family history, past medical history, past social history, past surgical history and problem list.  Review of Systems Pertinent items noted in HPI and remainder of comprehensive ROS otherwise negative.  Physical Exam:  BP 114/70   Pulse 65   Wt 185 lb 4.8 oz (84.1 kg)   LMP 02/19/2024   BMI 30.84 kg/m  CONSTITUTIONAL: Well-developed, well-nourished female in no acute distress.  HENT:  Normocephalic, atraumatic, External right and left ear normal. Oropharynx is clear and moist EYES: Conjunctivae and EOM are normal.  NECK: Normal range of motion, supple, no masses.  Normal thyroid.  SKIN: Skin is warm and dry. No rash noted. Not diaphoretic. No erythema. No pallor. MUSCULOSKELETAL: Normal range of motion. No tenderness.  No cyanosis, clubbing, or edema.  2+ distal pulses. NEUROLOGIC: Alert and oriented to person, place, and time. Normal reflexes, muscle tone coordination.  PSYCHIATRIC: Normal mood and affect. Normal behavior. Normal judgment and thought content. CARDIOVASCULAR: Normal heart rate noted, regular rhythm RESPIRATORY: Clear  to auscultation bilaterally. Effort and breath sounds normal, no problems with respiration noted. BREASTS: deferred ABDOMEN: Soft, no distention noted.  No tenderness, rebound or guarding.  PELVIC: Normal appearing external genitalia and urethral meatus; normal appearing vaginal mucosa and cervix.  No abnormal discharge noted.  Pap smear obtained.  Normal uterine size, no other palpable masses, no uterine or adnexal tenderness.  IUD strings easily seen. Performed in the presence of a chaperone.   Assessment and Plan:    1. Women's annual routine gynecological examination (Primary) Normal annual exam - Cytology - PAP( [place])  2. Routine screening for STI (sexually transmitted  infection) Per pt request. - Cervicovaginal ancillary only( [place])  3. Dysuria UA  and history suggestive of UTI.  Urine culture pending. Will treat empirically. Pt advised to increase hydration.  - Urine Culture - POCT Urinalysis Dipstick - cefadroxil (DURICEF) 500 MG capsule; Take 1 capsule (500 mg total) by mouth 2 (two) times daily.  Dispense: 14 capsule; Refill: 0  4. IUD (intrauterine device) in place Per pt placed in 2022, latest removal would be 2030 or 2031  Will follow up results of pap smear and manage accordingly. Routine preventative health maintenance measures emphasized. Please refer to After Visit Summary for other counseling recommendations.      Jerilynn Buddle, MD, FACOG Obstetrician & Gynecologist, [facility] for [facility], [facility]

## 2024-02-23 LAB — CERVICOVAGINAL ANCILLARY ONLY
Bacterial Vaginitis (gardnerella): POSITIVE — AB
Candida Glabrata: NEGATIVE
Candida Vaginitis: NEGATIVE
Comment: NEGATIVE
Comment: NEGATIVE
Comment: NEGATIVE

## 2024-02-24 ENCOUNTER — Ambulatory Visit: Payer: Self-pay | Admitting: Obstetrics and Gynecology

## 2024-02-24 ENCOUNTER — Other Ambulatory Visit: Payer: Self-pay

## 2024-02-24 DIAGNOSIS — B9689 Other specified bacterial agents as the cause of diseases classified elsewhere: Secondary | ICD-10-CM

## 2024-02-24 LAB — URINE CULTURE

## 2024-02-24 MED ORDER — METRONIDAZOLE 500 MG PO TABS: 500.0000 mg | ORAL_TABLET | Freq: Two times a day (BID) | ORAL | 0 refills | Status: AC

## 2024-02-26 LAB — CYTOLOGY - PAP
Adequacy: ABSENT
Comment: NEGATIVE
High risk HPV: NEGATIVE

## 2024-03-01 ENCOUNTER — Telehealth: Payer: Self-pay | Admitting: Obstetrics and Gynecology

## 2024-03-11 ENCOUNTER — Encounter: Admitting: Obstetrics & Gynecology

## 2024-04-02 ENCOUNTER — Other Ambulatory Visit (HOSPITAL_COMMUNITY)
Admission: RE | Admit: 2024-04-02 | Discharge: 2024-04-02 | Disposition: A | Discharge status: Home / Self Care | Source: Ambulatory Visit | Attending: Obstetrics and Gynecology | Admitting: Obstetrics and Gynecology

## 2024-04-02 ENCOUNTER — Ambulatory Visit (INDEPENDENT_AMBULATORY_CARE_PROVIDER_SITE_OTHER): Admitting: Obstetrics and Gynecology

## 2024-04-02 VITALS — BP 115/69 | HR 77 | Ht 65.0 in | Wt 181.6 lb

## 2024-04-02 DIAGNOSIS — N870 Mild cervical dysplasia: Secondary | ICD-10-CM | POA: Diagnosis not present

## 2024-04-02 DIAGNOSIS — R6889 Other general symptoms and signs: Secondary | ICD-10-CM | POA: Diagnosis not present

## 2024-04-02 DIAGNOSIS — Z3202 Encounter for pregnancy test, result negative: Secondary | ICD-10-CM | POA: Diagnosis not present

## 2024-04-02 LAB — POCT URINE PREGNANCY: Preg Test, Ur: NEGATIVE

## 2024-04-02 NOTE — Progress Notes (Signed)
 Pt presents for colpo. Pt has no questions or concerns at this time.

## 2024-04-02 NOTE — Progress Notes (Signed)
    GYNECOLOGY CLINIC COLPOSCOPY PROCEDURE NOTE  23 y.o. [id number] here for colposcopy for pap finding of:  Result Date Procedure Results Follow-ups  02/20/2024 Cytology - PAP( [place]) High risk HPV: Negative Adequacy: Satisfactory for evaluation; transformation zone component ABSENT. Diagnosis: - Low grade squamous intraepithelial lesion (LSIL) (A) Comment: Normal Reference Range HPV - Negative   10/03/2020 Cytology - PAP Adequacy: Satisfactory for evaluation; transformation zone component PRESENT. Diagnosis: - Negative for intraepithelial lesion or malignancy (NILM)     Discussed role for HPV in cervical dysplasia, need for surveillance, nature of the procedure, and risks and benefits.  Pregnancy test: Lab Results  Component Value Date   PREGTESTUR Negative 04/02/2024    No Known Allergies  Patient given informed consent, signed copy in the chart, time out was performed.    Placed in lithotomy position. Cervix viewed with speculum and colposcope after application of acetic acid and lugol's solution.   Colposcopy Adequacy Cervix fully visualized: Yes  SCJ fully visualized: Yes  Colposcopy Findings no visible lesions, no mosaicism, no punctation, and no abnormal vasculature  Corresponding biopsies were not obtained.    ECC specimen was obtained.  All specimens were labeled and sent to pathology.  Hemostatic measures: None  Complications: none  Patient tolerated the procedure well.  OBGyn Exam  Colposcopy Impressions Normal  Plan If ECC normal or CIN 1, advise repap in 1 year  Patient was given post procedure instructions.  Will follow up pathology and manage accordingly; patient will be contacted with results and recommendations.  Routine preventative health maintenance measures emphasized.   Jerilynn DELENA Buddle, MD

## 2024-04-06 ENCOUNTER — Ambulatory Visit: Payer: Self-pay | Admitting: Obstetrics and Gynecology

## 2024-04-06 LAB — SURGICAL PATHOLOGY

## 2024-07-05 ENCOUNTER — Encounter (INDEPENDENT_AMBULATORY_CARE_PROVIDER_SITE_OTHER): Payer: Self-pay | Admitting: Nurse Practitioner

## 2024-07-05 ENCOUNTER — Ambulatory Visit (INDEPENDENT_AMBULATORY_CARE_PROVIDER_SITE_OTHER): Admitting: Nurse Practitioner

## 2024-07-05 VITALS — BP 105/52 | HR 69 | Temp 98.9°F | Ht 64.0 in | Wt 187.0 lb

## 2024-07-05 DIAGNOSIS — Z6832 Body mass index (BMI) 32.0-32.9, adult: Secondary | ICD-10-CM

## 2024-07-05 DIAGNOSIS — E66811 Obesity, class 1: Secondary | ICD-10-CM | POA: Diagnosis not present

## 2024-07-05 DIAGNOSIS — E559 Vitamin D deficiency, unspecified: Secondary | ICD-10-CM | POA: Diagnosis not present

## 2024-07-05 DIAGNOSIS — R7309 Other abnormal glucose: Secondary | ICD-10-CM

## 2024-07-05 NOTE — Progress Notes (Signed)
 [address] [place], [place], [place] [address] Office: [phone]  /  Fax: [phone]   Initial Consultation    Donna Simon was seen in clinic today to evaluate for obesity. She is interested in losing weight to improve overall health and reduce the risk of weight related complications. She presents today to review program treatment options, initial physical assessment, and evaluation.    Anthropometrics and Bioimpedance Analysis   Body mass index is 32.1 kg/m. Body Fat Mass : 39.3 % Visceral Fat Mass Rating : 6   Obesity Related Diseases and Complications  Obesity Quality of Life and Psychosocial Complications: Body image dissatisfaction, Reduced health-related quality of life, and Decrease physical activity and social participation  Cardiometabolic: DOE and Fatigue  Biomechanical: None  Weight Related History  She was referred by: Friend or Family  When asked what they would like to accomplish? She states: Adopt a healthier eating pattern and lifestyle, Improve energy levels and physical activity, Improve existing medical conditions, Improve quality of life, Improve appearance, Improve self-confidence, and Lose 40 lbs  Weight history: She had a baby in 2023 and gained to 204 and got down to 167 and has gradually gained weight since then.   Highest weight: 187  Contributing factors: family history of obesity, consumption of processed foods, moderate to high levels of stress, reduced physical activity, chronic skipping of meals, strong orexigenic signaling and/or inadequate inhibitory control , need for convenience due to lack of time, enticing relationships and enviroment, multiple weight loss attempts in the past, sedentary job, hectic pace of life, need for convenient foods, and self - critic or all-or-none mindset  Prior weight loss attempts: Noom and Balanced Plate / Portion Control  Current or previous pharmacotherapy: None  Response to medication: Never tried  medications  Current nutrition plan: None  Greatest challenge with dieting: meal preparation and cooking and expensive food.  Current level of physical activity: Walking 20 minutes, three a week  Barriers to Exercise: time and energy  Readiness and Motivation  On a scale from 0 to 10 How ready are you to make changes to your eating and physical activity to lose weight? 10 How important is it for you to lose weight right now ? 10 How confident are you that you can lose weight if you try? 10  Past Medical History  History reviewed. No pertinent past medical history.   Objective    BP (!) 105/52   Pulse 69   Temp 98.9 F (37.2 C)   Ht 5' 4 (1.626 m)   Wt 187 lb (84.8 kg)   SpO2 100%   Breastfeeding No   BMI 32.10 kg/m  She was weighed on the bioimpedance scale: Body mass index is 32.1 kg/m.    General:  Alert, oriented and cooperative. Patient is in no acute distress.  Respiratory: Normal respiratory effort, no problems with respiration noted   Gait: able to ambulate independently  Mental Status: Normal mood and affect. Normal behavior. Normal judgment and thought content.   Diagnostic Data Reviewed  BMET    Component Value Date/Time   NA 139 12/31/2020 2105   K 4.6 12/31/2020 2105   CL 107 12/31/2020 2105   CO2 27 12/31/2020 2105   GLUCOSE 83 12/31/2020 2105   BUN 10 12/31/2020 2105   CREATININE 0.74 12/31/2020 2105   CALCIUM 9.1 12/31/2020 2105   GFRNONAA >60 12/31/2020 2105   Lab Results  Component Value Date   HGBA1C 5.3 05/31/2021   HGBA1C 5.1 04/04/2020  No results found for: INSULIN CBC    Component Value Date/Time   WBC 12.7 (H) 02/14/2022 2136   RBC 3.65 (L) 02/14/2022 2136   HGB 11.1 (L) 02/14/2022 2136   HGB 10.7 (L) 12/04/2021 1118   HCT 32.3 (L) 02/14/2022 2136   HCT 31.5 (L) 12/04/2021 1118   PLT 244 02/14/2022 2136   PLT 212 12/04/2021 1118   MCV 88.5 02/14/2022 2136   MCV 95 12/04/2021 1118   MCH 30.4 02/14/2022 2136   MCHC  34.4 02/14/2022 2136   RDW 14.9 02/14/2022 2136   RDW 13.6 12/04/2021 1118   Iron/TIBC/Ferritin/ %Sat No results found for: IRON, TIBC, FERRITIN, IRONPCTSAT Lipid Panel  No results found for: CHOL, TRIG, HDL, CHOLHDL, VLDL, LDLCALC, LDLDIRECT Hepatic Function Panel     Component Value Date/Time   PROT 6.7 12/31/2020 2105   ALBUMIN 3.4 (L) 12/31/2020 2105   AST 22 12/31/2020 2105   ALT 21 12/31/2020 2105   ALKPHOS 58 12/31/2020 2105   BILITOT 0.3 12/31/2020 2105      Component Value Date/Time   TSH 0.600 05/31/2021 1635    Medications  Outpatient Encounter Medications as of 07/05/2024  Medication Sig   PARAGARD  INTRAUTERINE COPPER  IU 1 Intra Uterine Device by Intrauterine route once.   cefadroxil  (DURICEF) 500 MG capsule Take 1 capsule (500 mg total) by mouth 2 (two) times daily. (Patient not taking: Reported on 07/05/2024)   No facility-administered encounter medications on file as of 07/05/2024.     Assessment and Plan   Vitamin D deficiency Low vitamin D levels can be associated with adiposity and may result in leptin resistance and weight gain. Also associated with fatigue.  Recheck Vit D level at first visit  Elevated glucose level Decreasing body weight by 10-15% can improve glucose levels Limit simple carbohydrates  Class 1 obesity without serious comorbidity with body mass index (BMI) of 32.0 to 32.9 in adult, unspecified obesity type Obesity Treatment and Action Plan:  Patient will work on garnering support from family and friends to begin weight loss journey. Will work on eliminating or reducing the presence of highly palatable, calorie dense foods in the home. Will complete provided nutritional and psychosocial assessment questionnaire before the next appointment. Will be scheduled for indirect calorimetry to determine resting energy expenditure in a fasting state.  This will allow us  to create a reduced calorie, high-protein meal plan to  promote loss of fat mass while preserving muscle mass. Counseled on the health benefits of losing 5%-15% of total body weight. Was counseled on nutritional approaches to weight loss and benefits of reducing processed foods and consuming plant-based foods and high quality protein as part of nutritional weight management. Was counseled on pharmacotherapy and role as an adjunct in weight management.   Education and Additional resources  She was weighed on the bioimpedance scale and results were discussed and documented in the synopsis.  We discussed obesity as a progressive, chronic disease and the importance of a more detailed evaluation of all the factors contributing to the disease.  We reviewed the basic principles in obesity management.   We discussed the importance of long term lifestyle changes which include nutrition, exercise and behavioral modification as well as the importance of customizing this to her specific health and social needs.  We reviewed the role of medical interventions including pharmacotherapy and surgical interventions.   We discussed the benefits of reaching a healthier weight to alleviate the symptoms of existing conditions and reduce the risks of  the biomechanical, cardiometabolic and psychological effects of obesity.  We reviewed our program approach and philosophy, which are guided by the four pillars of obesity medicine.  We discussed how to prepare for intake appointment and the importance of fasting and avoidance of stimulants for at least 8 hours prior to indirect calorimetry.  Donyell Carrell appears to be in the action stage of change and reports being ready to initiate intensive lifestyle and behavioral modifications as part of their weight loss journey.  Attestation  Reviewed by clinician on day of visit: allergies, medications, problem list, medical history, surgical history, family history, social history, and previous encounter notes pertinent to  obesity diagnosis.  I personally spent a total of 30 minutes in the care of the patient today including preparing to see the patient, getting/reviewing separately obtained history, performing a medically appropriate exam/evaluation, counseling and educating, and documenting clinical information in the EHR.   Joren Rehm ANP-C

## 2024-07-13 ENCOUNTER — Ambulatory Visit (HOSPITAL_COMMUNITY)
Admission: EM | Admit: 2024-07-13 | Discharge: 2024-07-13 | Disposition: A | Discharge status: Home / Self Care | Attending: Emergency Medicine | Admitting: Emergency Medicine

## 2024-07-13 ENCOUNTER — Encounter (HOSPITAL_COMMUNITY): Payer: Self-pay | Admitting: *Deleted

## 2024-07-13 DIAGNOSIS — J020 Streptococcal pharyngitis: Secondary | ICD-10-CM | POA: Diagnosis not present

## 2024-07-13 LAB — POCT RAPID STREP A (OFFICE): Rapid Strep A Screen: POSITIVE — AB

## 2024-07-13 MED ORDER — AMOXICILLIN 500 MG PO CAPS: 500.0000 mg | ORAL_CAPSULE | Freq: Two times a day (BID) | ORAL | 0 refills | Status: AC

## 2024-07-13 MED ORDER — PREDNISOLONE SODIUM PHOSPHATE 15 MG/5ML PO SOLN
ORAL | Status: AC
  Filled 2024-07-13: qty 2

## 2024-07-13 MED ORDER — PREDNISOLONE SODIUM PHOSPHATE 15 MG/5ML PO SOLN
30.0000 mg | Freq: Once | ORAL | Status: AC
  Administered 2024-07-13: 30 mg via ORAL

## 2024-07-13 NOTE — Discharge Instructions (Addendum)
 Take the amoxicillin  with food twice daily for 10 days to treat your strep throat  You can alternate between Tylenol  and ibuprofen  every 4-6 hours for fever and pain with swallowing Symptoms should improve over the next few days with antibiotics  If no improvement or any changes return to clinic for reevaluation

## 2024-07-13 NOTE — ED Provider Notes (Signed)
 [facility]    CSN: [id number] Arrival date & time: 07/13/24  1022      History   Chief Complaint Chief Complaint  Patient presents with   Fever   Sore Throat    HPI Marshell Rieger is a 23 y.o. female.   Patient presents to clinic over concern of fever, sore throat and pain with swallowing for the past three days. Has been taking Nyquil and Dayquil for fevers, last dose around 6am. Symptoms seem to be getting worse so patient came into clinic for evaluation. Without cough or congestion.   The history is provided by the patient and medical records.  Fever Sore Throat    History reviewed. No pertinent past medical history.  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 Authorizing Provider  amoxicillin  (AMOXIL ) 500 MG capsule Take 1 capsule (500 mg total) by mouth 2 (two) times daily for 10 days. 07/13/24 07/23/24 Yes Benicia Bergevin  N, FNP  PARAGARD  INTRAUTERINE COPPER  IU 1 Intra Uterine Device by Intrauterine route once.    [provider]    Family History Family History  Problem Relation Age of Onset   Hypertension Mother    Obesity Mother    Miscarriages / Stillbirths Mother    Obesity Father    Breast cancer Paternal Aunt        dx. 77s, metastatic   Breast cancer Paternal Uncle        dx. 50s    Stomach cancer Maternal Great-grandmother     Social History Social History   Tobacco Use   Smoking status: Never   Smokeless tobacco: Never  Vaping Use   Vaping status: Never Used  Substance Use Topics   Alcohol use: Not Currently   Drug use: Never     Allergies   Patient has no known allergies.   Review of Systems Review of Systems  Per HPI  Physical Exam Triage Vital Signs ED Triage Vitals  Encounter Vitals Group     BP 07/13/24 1038 114/73     Girls Systolic BP Percentile --      Girls Diastolic BP Percentile --      Boys Systolic BP Percentile --      Boys Diastolic BP Percentile --      Pulse Rate 07/13/24 1038 99     Resp 07/13/24 1038 16     Temp 07/13/24 1038 99.4 F (37.4 C)     Temp Source 07/13/24 1038 Oral     SpO2 07/13/24 1038 98 %     Weight --  Height --      Head Circumference --      Peak Flow --      Pain Score 07/13/24 1037 8     Pain Loc --      Pain Education --      Exclude from Growth Chart --    No data found.  Updated Vital Signs BP 114/73 (BP Location: Right Arm)   Pulse 99   Temp 99.4 F (37.4 C) (Oral)   Resp 16   LMP 07/13/2024 (Exact Date)   SpO2 98%   Visual Acuity Right Eye Distance:   Left Eye Distance:   Bilateral Distance:    Right Eye Near:   Left Eye Near:    Bilateral Near:     Physical Exam Vitals and nursing note reviewed.  Constitutional:      Appearance: Normal appearance. She is well-developed.  HENT:     Head: Normocephalic and atraumatic.     Right Ear: External ear normal.     Left Ear: External ear normal.     Nose: No congestion or rhinorrhea.     Mouth/Throat:     Mouth: Mucous membranes are moist.     Pharynx: Uvula midline. Posterior oropharyngeal erythema present.     Tonsils: Tonsillar exudate present. No tonsillar abscesses. 1+ on the right. 1+ on the left.  Eyes:     Conjunctiva/sclera: Conjunctivae normal.  Cardiovascular:     Rate and Rhythm: Normal rate.   Pulmonary:     Effort: Pulmonary effort is normal.  Skin:    General: Skin is warm and dry.  Neurological:     General: No focal deficit present.     Mental Status: She is alert.  Psychiatric:        Mood and Affect: Mood normal.      UC Treatments / Results  Labs (all labs ordered are listed, but only abnormal results are displayed) Labs Reviewed  POCT RAPID STREP A (OFFICE) - Abnormal; Notable for the following components:      Result Value   Rapid Strep A Screen Positive (*)    All other components within normal limits    EKG   Radiology No results found.  Procedures Procedures (including critical care time)  Medications Ordered in UC Medications  prednisoLONE  (ORAPRED ) 15 MG/5ML solution 30 mg (has no administration in time range)    Initial Impression / Assessment and Plan / UC Course  I have reviewed the triage vital signs and the nursing notes.  Pertinent labs & imaging results that were available during my care of the patient were reviewed by me and considered in my medical decision making (see chart for details).  Vitals and triage reviewed, patient is hemodynamically stable.  Able to speak in full sentences, oxygenation 98%, no acute distress.  Uvula midline, low concern for PTA.  POC rapid strep positive, tonsils with bilateral exudate.  Orapred  given in clinic for discomfort and pain with swallowing.  Amoxicillin  for strep throat.  Plan of care, follow-up care and return precautions given, no questions at this time.    Final Clinical Impressions(s) / UC Diagnoses   Final diagnoses:  Strep pharyngitis     Discharge Instructions      Take the amoxicillin  with food twice daily for 10 days to treat your strep throat  You can alternate between Tylenol  and ibuprofen  every 4-6 hours for fever and pain with swallowing Symptoms should improve over the next few days with antibiotics  If  no improvement or any changes return to clinic for  reevaluation    ED Prescriptions     Medication Sig Dispense Auth. Provider   amoxicillin  (AMOXIL ) 500 MG capsule Take 1 capsule (500 mg total) by mouth 2 (two) times daily for 10 days. 20 capsule Dreama, Oberia Beaudoin  N, FNP      PDMP not reviewed this encounter.   Dreama, Amar Keenum  N, FNP 07/13/24 1059

## 2024-07-13 NOTE — ED Triage Notes (Signed)
 Pt states she has fever and sore throat since Sunday. She has been taking nyquil and dayquil.
